# Patient Record
Sex: Female | Born: 1995 | Race: White | Hispanic: No | Marital: Married | Smoking: Never smoker
Health system: Southern US, Community
[De-identification: ages and names within clinical notes are randomized; demographics above are authoritative.]

## PROBLEM LIST (undated history)

## (undated) HISTORY — PX: WISDOM TOOTH EXTRACTION: SHX21

---
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
## 2017-07-17 ENCOUNTER — Ambulatory Visit (INDEPENDENT_AMBULATORY_CARE_PROVIDER_SITE_OTHER): Payer: Managed Care, Other (non HMO)

## 2017-07-17 ENCOUNTER — Encounter (HOSPITAL_COMMUNITY): Payer: Self-pay | Admitting: Emergency Medicine

## 2017-07-17 ENCOUNTER — Ambulatory Visit (HOSPITAL_COMMUNITY)
Admission: EM | Admit: 2017-07-17 | Discharge: 2017-07-17 | Disposition: A | Discharge status: Home / Self Care | Payer: Managed Care, Other (non HMO) | Attending: Internal Medicine | Admitting: Internal Medicine

## 2017-07-17 DIAGNOSIS — S99922A Unspecified injury of left foot, initial encounter: Secondary | ICD-10-CM

## 2017-07-17 NOTE — ED Provider Notes (Signed)
[facility]    CSN: [id number] Arrival date & time: 07/17/17  1100     History   Chief Complaint Chief Complaint  Patient presents with  . Foot Injury    HPI Judith GriffinsLydia A Nguyen is a 21 y.o. female.   Presenting today for left foot and ankle pain onset 5 days ago after a man landed on her playing volleyball. She reports pain to be 4/10 currently. Pain is most prominent at the 4 and 5th metatarsal area and over the lateral ankle area. Patient have tried RICE therapy at home with no relief. She is able to ambulate. She has no numbness or tingling or weakness.   HPI  History reviewed. No pertinent past medical history.  There are no active problems to display for this patient.   History reviewed. No pertinent surgical history.  OB History    No data available       Home Medications    Prior to Admission medications   Not on File    Family History History reviewed. No pertinent family history.  Social History Social History   Tobacco Use  . Smoking status: Never Smoker  . Smokeless tobacco: Never Used  Substance Use Topics  . Alcohol use: No    Frequency: Never  . Drug use: No     Allergies   Patient has no known allergies.   Review of Systems Review of Systems  Constitutional:       See HPI     Physical Exam Triage Vital Signs ED Triage Vitals  Enc Vitals Group     BP 07/17/17 1123 (!) 148/89     Pulse Rate 07/17/17 1123 89     Resp 07/17/17 1123 16     Temp 07/17/17 1123 98.5 F (36.9 C)     Temp Source 07/17/17 1123 Oral     SpO2 07/17/17 1123 99 %     Weight --      Height --      Head Circumference --      Peak Flow --      Pain Score 07/17/17 1124 6     Pain Loc --      Pain Edu? --      Excl. in GC? --    No data found.  Updated Vital Signs BP (!) 148/89 (BP Location: Left Arm)   Pulse 89   Temp 98.5 F (36.9 C) (Oral)   Resp 16   LMP 07/10/2017 (Exact Date)   SpO2 99%   Physical Exam  Constitutional: She is  oriented to person, place, and time. She appears well-developed and well-nourished.  Cardiovascular: Normal rate.  Pulmonary/Chest: Effort normal.  Musculoskeletal:  Left foot and ankle has full ROM. No swelling. No bruising. Pain on palpation noted over 4th and 5th metatarsal and over the lateral malleolus area. She is able to ambulate and bear weight.   Neurological: She is alert and oriented to person, place, and time.  Nursing note and vitals reviewed.    UC Treatments / Results  Labs (all labs ordered are listed, but only abnormal results are displayed) Labs Reviewed - No data to display  EKG  EKG Interpretation None       Radiology Dg Ankle Complete Left  Result Date: 07/17/2017 CLINICAL DATA:  Ankle pain post sports trauma. EXAM: LEFT ANKLE COMPLETE - 3+ VIEW COMPARISON:  None. FINDINGS: There is no evidence of fracture, dislocation, or joint effusion. There is no evidence of arthropathy or  other focal bone abnormality. Soft tissues are unremarkable. IMPRESSION: Negative. Electronically Signed   By: Ted Mcalpineobrinka  Dimitrova M.D.   On: 07/17/2017 12:02   Dg Foot Complete Left  Result Date: 07/17/2017 CLINICAL DATA:  Per pt: Monday night during a volleyball game, blocking a spike and the opponent came down on the left foot and ankle. Pain is the left foot, anterior left ankle, superior metatarsal and lateral left foot with pain extending to the lateral os calcis. EXAM: LEFT FOOT - COMPLETE 3+ VIEW COMPARISON:  07/17/2017 FINDINGS: There is no evidence of fracture or dislocation. There is no evidence of arthropathy or other focal bone abnormality. Soft tissues are unremarkable. IMPRESSION: Negative. Electronically Signed   By: Norva PavlovElizabeth  Brown M.D.   On: 07/17/2017 12:08    Procedures Procedures (including critical care time)  Medications Ordered in UC Medications - No data to display   Initial Impression / Assessment and Plan / UC Course  I have reviewed the triage vital signs  and the nursing notes.  Pertinent labs & imaging results that were available during my care of the patient were reviewed by me and considered in my medical decision making (see chart for details).  Final Clinical Impressions(s) / UC Diagnoses   Final diagnoses:  Injury of left foot, initial encounter   Xrays negative for fracture. Continue the RICE therapy. Avoid sport until pain resolved. F/u with PCP or Orthopedic for no improvement.    ED Discharge Orders    None     Controlled Substance Prescriptions [place] Controlled Substance Registry consulted? Not Applicable   Lucia EstelleZheng, Nygel Prokop, NP 07/17/17 1324

## 2017-07-17 NOTE — ED Triage Notes (Signed)
Pt reports left foot inj/pain onset 5 days  Reports she was playing volleyball and somebody landed on her foot  C/o pain on top of foot and lateral malleolus   camwalker present upon arrival.  A&o x4... NAD... Ambulatory

## 2017-10-13 ENCOUNTER — Other Ambulatory Visit: Payer: Self-pay

## 2017-10-13 ENCOUNTER — Emergency Department (HOSPITAL_COMMUNITY)
Admission: EM | Admit: 2017-10-13 | Discharge: 2017-10-13 | Disposition: A | Discharge status: Home / Self Care | Payer: Managed Care, Other (non HMO) | Attending: Emergency Medicine | Admitting: Emergency Medicine

## 2017-10-13 ENCOUNTER — Emergency Department (HOSPITAL_COMMUNITY): Payer: Managed Care, Other (non HMO)

## 2017-10-13 DIAGNOSIS — R1013 Epigastric pain: Secondary | ICD-10-CM | POA: Diagnosis present

## 2017-10-13 DIAGNOSIS — R1011 Right upper quadrant pain: Secondary | ICD-10-CM

## 2017-10-13 DIAGNOSIS — K2900 Acute gastritis without bleeding: Secondary | ICD-10-CM | POA: Diagnosis not present

## 2017-10-13 LAB — COMPREHENSIVE METABOLIC PANEL
ALT: 11 U/L — AB (ref 14–54)
AST: 17 U/L (ref 15–41)
Albumin: 4.2 g/dL (ref 3.5–5.0)
Alkaline Phosphatase: 43 U/L (ref 38–126)
Anion gap: 12 (ref 5–15)
BUN: 10 mg/dL (ref 6–20)
CO2: 20 mmol/L — AB (ref 22–32)
CREATININE: 0.84 mg/dL (ref 0.44–1.00)
Calcium: 9.4 mg/dL (ref 8.9–10.3)
Chloride: 105 mmol/L (ref 101–111)
GFR calc Af Amer: >60 mL/min (ref >60)
GLUCOSE: 100 mg/dL — AB (ref 65–99)
Potassium: 4 mmol/L (ref 3.5–5.1)
SODIUM: 137 mmol/L (ref 135–145)
Total Bilirubin: 0.8 mg/dL (ref 0.3–1.2)
Total Protein: 7 g/dL (ref 6.5–8.1)

## 2017-10-13 LAB — I-STAT BETA HCG BLOOD, ED (MC, WL, AP ONLY): I-stat hCG, quantitative: <5 m[IU]/mL (ref <5)

## 2017-10-13 LAB — CBC
HCT: 40.6 % (ref 36.0–46.0)
Hemoglobin: 13.7 g/dL (ref 12.0–15.0)
MCH: 29.4 pg (ref 26.0–34.0)
MCHC: 33.7 g/dL (ref 30.0–36.0)
MCV: 87.1 fL (ref 78.0–100.0)
PLATELETS: 211 10*3/uL (ref 150–400)
RBC: 4.66 MIL/uL (ref 3.87–5.11)
RDW: 12.2 % (ref 11.5–15.5)
WBC: 6.1 10*3/uL (ref 4.0–10.5)

## 2017-10-13 LAB — LIPASE, BLOOD: Lipase: 42 U/L (ref 11–51)

## 2017-10-13 MED ORDER — SUCRALFATE 1 GM/10ML PO SUSP: 1.0000 g | Freq: Three times a day (TID) | ORAL | 0 refills | Status: DC

## 2017-10-13 MED ORDER — PANTOPRAZOLE SODIUM 40 MG IV SOLR
40.0000 mg | Freq: Once | INTRAVENOUS | Status: AC
  Administered 2017-10-13: 40 mg via INTRAVENOUS
  Filled 2017-10-13: qty 40

## 2017-10-13 MED ORDER — SUCRALFATE 1 GM/10ML PO SUSP
1.0000 g | Freq: Once | ORAL | Status: AC
  Administered 2017-10-13: 1 g via ORAL
  Filled 2017-10-13: qty 10

## 2017-10-13 MED ORDER — ONDANSETRON HCL 4 MG/2ML IJ SOLN
4.0000 mg | Freq: Once | INTRAMUSCULAR | Status: AC
  Administered 2017-10-13: 4 mg via INTRAVENOUS
  Filled 2017-10-13: qty 2

## 2017-10-13 MED ORDER — PANTOPRAZOLE SODIUM 20 MG PO TBEC: 20.0000 mg | DELAYED_RELEASE_TABLET | Freq: Every day | ORAL | 0 refills | Status: DC

## 2017-10-13 MED ORDER — ONDANSETRON HCL 4 MG PO TABS: 4.0000 mg | ORAL_TABLET | Freq: Four times a day (QID) | ORAL | 0 refills | Status: DC

## 2017-10-13 MED ORDER — GI COCKTAIL ~~LOC~~: 30.0000 mL | Freq: Once | ORAL | Status: DC

## 2017-10-13 NOTE — ED Triage Notes (Signed)
Pt states yesterday she started having epigastric pain with nausea. No vomiting  or diarrhea.

## 2017-10-13 NOTE — Discharge Instructions (Signed)
Medications: Protonix, Carafate, Zofran  Treatment: Take Protonix once daily. Take Carafate 4 times daily, once before each meal and at bedtime. Take Zofran every 6 hours as needed for nausea or vomiting.  Follow-up: Please follow up with gastroenterology for further evaluation and treatment of your symptoms. Please return to the emergency department if you develop any new or worsening symptoms including severe pain lasting more the 6-8 hours, intractable vomiting, dark or bloody stools, fever over 100.4, or any other concerning symptoms.

## 2017-10-13 NOTE — ED Provider Notes (Signed)
[facility]ENC[facility] Provider Note   CSN: [id number] Arrival date & time: 10/13/17  0703     History   Chief Complaint Chief Complaint  Patient presents with  . Abdominal Pain    HPI Judith GriffinsLydia A Nguyen is a 22 y.o. female is previously healthy who presents with pain around 1 AM this evening.  Patient reports she has had associated nausea related to severe pain, but no vomiting.  She reports intermittent, waxing and waning sharp pain in her epigastric area.  Her pain does not radiate.  She reports having one episode of diarrhea last night, however none today.  No bloody stools.  She reports she has had some mild nasal congestion, however no cough, fever, or body aches.  She reports having soup and toast last night around 7:30 PM.  She did not have anything that time and 1 AM when her pain started.  She has never experienced anything like this before.  She took ibuprofen at home without relief.  States she has been taking ibuprofen over the past 3 months and she sprained her ankle.  She reports she has not been taking as much lately, but was taking 3 or 4 200 mg tablets at a time, sometimes more than once a day.  She denies any chest pain, shortness of breath, urinary symptoms, abnormal vaginal bleeding or discharge.  LMP 09/15/2017.  Patient reports she drinks alcohol, but usually only 1 glass of wine per night.  HPI  No past medical history on file.  There are no active problems to display for this patient.   No past surgical history on file.  OB History    No data available       Home Medications    Prior to Admission medications   Medication Sig Start Date End Date Taking? Authorizing Provider  ondansetron (ZOFRAN) 4 MG tablet Take 1 tablet (4 mg total) by mouth every 6 (six) hours. 10/13/17   Nazario Russom, Waylan BogaAlexandra M, PA-C  pantoprazole (PROTONIX) 20 MG tablet Take 1 tablet (20 mg total) by mouth daily. 10/13/17   Reeve Turnley, Waylan BogaAlexandra M, PA-C  sucralfate (CARAFATE) 1  GM/10ML suspension Take 10 mLs (1 g total) by mouth 4 (four) times daily -  with meals and at bedtime. 10/13/17   Emi HolesLaw, Markeith Jue M, PA-C    Family History No family history on file.  Social History Social History   Tobacco Use  . Smoking status: Never Smoker  . Smokeless tobacco: Never Used  Substance Use Topics  . Alcohol use: No    Frequency: Never  . Drug use: No     Allergies   Patient has no known allergies.   Review of Systems Review of Systems  Constitutional: Negative for chills and fever.  HENT: Positive for congestion. Negative for facial swelling and sore throat.   Respiratory: Negative for shortness of breath.   Cardiovascular: Negative for chest pain.  Gastrointestinal: Positive for abdominal pain and nausea. Negative for vomiting.  Genitourinary: Negative for dysuria.  Musculoskeletal: Negative for back pain.  Skin: Negative for rash and wound.  Neurological: Negative for headaches.  Psychiatric/Behavioral: The patient is not nervous/anxious.      Physical Exam Updated Vital Signs BP 109/69 (BP Location: Right Arm)   Pulse (!) 57   Temp 98.6 F (37 C) (Oral)   Resp 16   LMP 09/15/2017   SpO2 100%   Physical Exam  Constitutional: She appears well-developed and well-nourished. No distress.  HENT:  Head:  Normocephalic and atraumatic.  Mouth/Throat: Oropharynx is clear and moist. No oropharyngeal exudate.  Eyes: Conjunctivae are normal. Pupils are equal, round, and reactive to light. Right eye exhibits no discharge. Left eye exhibits no discharge. No scleral icterus.  Neck: Normal range of motion. Neck supple. No thyromegaly present.  Cardiovascular: Regular rhythm, normal heart sounds and intact distal pulses. Exam reveals no gallop and no friction rub.  No murmur heard. Pulmonary/Chest: Effort normal and breath sounds normal. No stridor. No respiratory distress. She has no wheezes. She has no rales.  Abdominal: Soft. Bowel sounds are normal. She  exhibits no distension. There is tenderness in the right upper quadrant and epigastric area. There is positive Murphy's sign. There is no rebound, no guarding and no tenderness at McBurney's point.  Musculoskeletal: She exhibits no edema.  Lymphadenopathy:    She has no cervical adenopathy.  Neurological: She is alert. Coordination normal.  Skin: Skin is warm and dry. No rash noted. She is not diaphoretic. No pallor.  Psychiatric: She has a normal mood and affect.  Nursing note and vitals reviewed.    ED Treatments / Results  Labs (all labs ordered are listed, but only abnormal results are displayed) Labs Reviewed  COMPREHENSIVE METABOLIC PANEL - Abnormal; Notable for the following components:      Result Value   CO2 20 (*)    Glucose, Bld 100 (*)    ALT 11 (*)    All other components within normal limits  LIPASE, BLOOD  CBC  URINALYSIS, ROUTINE W REFLEX MICROSCOPIC  I-STAT BETA HCG BLOOD, ED ([facility], [facility], [facility] ONLY)    EKG  EKG Interpretation None       Radiology US Abdomen Limited Ruq  Result Date: 10/13/2017 CLINICAL DATA:  22 year old female with right upper quadrant pain for 2 days. Initial encounter. EXAM: ULTRASOUND ABDOMEN LIMITED RIGHT UPPER QUADRANT COMPARISON:  None. FINDINGS: Gallbladder: No gallstones or wall thickening visualized. No sonographic Murphy sign noted by sonographer. Common bile duct: Diameter: 1.6 mm Liver: No focal lesion identified. Within normal limits in parenchymal echogenicity. Portal vein is patent on color Doppler imaging with normal direction of blood flow towards the liver. IMPRESSION: Negative right upper quadrant ultrasound. Electronically Signed   By: Lacy Duverney M.D.   On: 10/13/2017 12:07    Procedures Procedures (including critical care time)  Medications Ordered in ED Medications  ondansetron (ZOFRAN) injection 4 mg (4 mg Intravenous Given 10/13/17 1116)  pantoprazole (PROTONIX) injection 40 mg (40 mg Intravenous Given 10/13/17 1116)    sucralfate (CARAFATE) 1 GM/10ML suspension 1 g (1 g Oral Given 10/13/17 1314)     Initial Impression / Assessment and Plan / ED Course  I have reviewed the triage vital signs and the nursing notes.  Pertinent labs & imaging results that were available during my care of the patient were reviewed by me and considered in my medical decision making (see chart for details).     Patient with suspected gastritis versus ulcer, possibly from patient's increased NSAID use from her sprained ankle.  Labs are unremarkable.  Right upper quadrant ultrasound is negative.  Patient is feeling better after IV Zofran and Protonix.  Will initiate Protonix, Zofran, Carafate with follow-up to GI for further evaluation.  Patient advised to avoid NSAIDs for right now.  Return precautions discussed.  Patient understands and agrees with plan.  Patient vitals stable throughout ED course and discharged in satisfactory condition.  Final Clinical Impressions(s) / ED Diagnoses   Final diagnoses:  Epigastric pain  RUQ pain  Other acute gastritis without hemorrhage    ED Discharge Orders        Ordered    pantoprazole (PROTONIX) 20 MG tablet  Daily     10/13/17 1238    sucralfate (CARAFATE) 1 GM/10ML suspension  3 times daily with meals & bedtime     10/13/17 1238    ondansetron (ZOFRAN) 4 MG tablet  Every 6 hours     10/13/17 1238       Emi Holes, PA-C 10/13/17 1419    Cathren Laine, MD 10/13/17 [phone]

## 2017-10-13 NOTE — ED Notes (Signed)
Patient in ultrasound.

## 2017-10-13 NOTE — ED Notes (Signed)
Called for vitals, no answer 

## 2018-05-16 ENCOUNTER — Ambulatory Visit (HOSPITAL_COMMUNITY)
Admission: EM | Admit: 2018-05-16 | Discharge: 2018-05-16 | Disposition: A | Discharge status: Home / Self Care | Payer: Managed Care, Other (non HMO) | Attending: Internal Medicine | Admitting: Internal Medicine

## 2018-05-16 ENCOUNTER — Encounter (HOSPITAL_COMMUNITY): Payer: Self-pay | Admitting: Emergency Medicine

## 2018-05-16 ENCOUNTER — Other Ambulatory Visit: Payer: Self-pay

## 2018-05-16 DIAGNOSIS — R52 Pain, unspecified: Secondary | ICD-10-CM

## 2018-05-16 DIAGNOSIS — R0789 Other chest pain: Secondary | ICD-10-CM

## 2018-05-16 DIAGNOSIS — R1010 Upper abdominal pain, unspecified: Secondary | ICD-10-CM

## 2018-05-16 DIAGNOSIS — J029 Acute pharyngitis, unspecified: Secondary | ICD-10-CM | POA: Diagnosis present

## 2018-05-16 LAB — POCT RAPID STREP A: STREPTOCOCCUS, GROUP A SCREEN (DIRECT): NEGATIVE

## 2018-05-16 MED ORDER — ACETAMINOPHEN 160 MG/5ML PO SOLN
ORAL | Status: AC
  Filled 2018-05-16: qty 20.3

## 2018-05-16 MED ORDER — ACETAMINOPHEN 160 MG/5ML PO SOLN
650.0000 mg | Freq: Once | ORAL | Status: AC
  Administered 2018-05-16: 650 mg via ORAL

## 2018-05-16 MED ORDER — PREDNISONE 20 MG PO TABS: ORAL_TABLET | ORAL | 0 refills | Status: DC

## 2018-05-16 NOTE — Discharge Instructions (Signed)
You may take 500mg  Tylenol every 6 hours for pain and inflammation. Benadryl at bedtime 50mg  can help you sleep tonight.

## 2018-05-16 NOTE — ED Triage Notes (Signed)
Sore throat, fever, and body aches started yesterday.  Patient looks like she does not feel well.

## 2018-05-16 NOTE — ED Provider Notes (Signed)
  MRN: [id number] DOB: 02-25-1996  Subjective:   Judith Nguyen is a 22 y.o. female presenting for 1 day history of throat pain, fever, body aches, malaise, belly pain, chest pain. Chest pain is over mid-sternum, constant, has difficulty breathing, rated 7/10. Belly pain is epigastric, constant, 8/10, non-radiating. Has a history of gastritis, has had to take Protonix, Carafate.  She is not currently taking these medications. Does not have a GI doctor or a PCP. She was taking ibuprofen, Advil, was advised that she may have had gastritis from this so she has stopped using it. Denies dysuria, hematuria, urinary frequency.   No Known Allergies  Past medical history of gastritis. Denies past surgical history. Her family history includes Healthy in her mother.   Objective:   Vitals: BP 111/67 (BP Location: Left Arm)   Pulse (!) 120   Temp (!) 100.9 F (38.3 C) (Oral)   Resp (!) 22   SpO2 100%   Physical Exam  Constitutional: She is oriented to person, place, and time. She appears well-developed and well-nourished.  Eyes: Right eye exhibits no discharge. Left eye exhibits no discharge. No scleral icterus.  Neck: Normal range of motion. Neck supple.  Cardiovascular: Normal rate, regular rhythm and intact distal pulses. Exam reveals no gallop and no friction rub.  No murmur heard. Pulmonary/Chest: Effort normal and breath sounds normal. No stridor. No respiratory distress. She has no wheezes. She has no rales. She exhibits tenderness.  Abdominal: Soft. Bowel sounds are normal. She exhibits no distension and no mass. There is tenderness. There is no rebound and no guarding.  Lymphadenopathy:    She has no cervical adenopathy.  Neurological: She is alert and oriented to person, place, and time.  Skin: Skin is warm and dry.   Results for orders placed or performed during the hospital encounter of 05/16/18 (from the past 24 hour(s))  POCT rapid strep A [facility])     Status: None   Collection Time: 05/16/18  3:28 PM  Result Value Ref Range   Streptococcus, Group A Screen (Direct) NEGATIVE NEGATIVE   Assessment and Plan :   Acute pharyngitis, unspecified etiology  Sore throat  Atypical chest pain  Upper abdominal pain  Body aches  Patient has severe chest and abdominal pain, I counseled patient and her mother that it would be important to obtain a CT of the abdomen to assure that she is not undergoing a medical emergency.  Patient and her mother were not agreeable to going to the ER at this moment.  We therefore decided to address her pharyngitis and severe throat pain with a steroid course given inflammation of her tonsils without exudates.  Strep culture is pending.  Recommend supportive care outside of that.  Counseled patient on potential for adverse effects with medications prescribed today, patient verbalized understanding.  ER precautions reviewed.   Wallis Bamberg, PA-C 05/16/18 1620

## 2018-05-19 LAB — CULTURE, GROUP A STREP (THRC)

## 2018-05-20 ENCOUNTER — Telehealth (HOSPITAL_COMMUNITY): Payer: Self-pay

## 2018-05-20 NOTE — Telephone Encounter (Signed)
Culture is positive for non group A Strep germ.  This is a finding of uncertain significance; not the typical 'strep throat' germ.  Attempted to reach patient. No answer at this time.  

## 2018-09-21 ENCOUNTER — Ambulatory Visit (INDEPENDENT_AMBULATORY_CARE_PROVIDER_SITE_OTHER): Payer: Managed Care, Other (non HMO)

## 2018-09-21 ENCOUNTER — Encounter: Payer: Self-pay | Admitting: Sports Medicine

## 2018-09-21 ENCOUNTER — Ambulatory Visit: Payer: Self-pay

## 2018-09-21 ENCOUNTER — Ambulatory Visit (INDEPENDENT_AMBULATORY_CARE_PROVIDER_SITE_OTHER): Payer: Managed Care, Other (non HMO) | Admitting: Sports Medicine

## 2018-09-21 VITALS — BP 110/70 | HR 74 | Ht 68.5 in | Wt 132.4 lb

## 2018-09-21 DIAGNOSIS — M25572 Pain in left ankle and joints of left foot: Secondary | ICD-10-CM

## 2018-09-21 DIAGNOSIS — M25872 Other specified joint disorders, left ankle and foot: Secondary | ICD-10-CM | POA: Diagnosis not present

## 2018-09-21 NOTE — Progress Notes (Signed)
Judith Nguyen. [facility] [phone]  Judith Nguyen - 23 y.o. female MRN [id number]  Date of birth: [birth year]  Visit Date: September 21, 2018  PCP: Patient, No Pcp Per   Referred by: No ref. provider found  SUBJECTIVE:  Chief Complaint  Patient presents with  . Initial Assessment  . L ankle pain    Sports injury 07/2017, XRs done. She has been wearing boot. Has been seen at [facility] more recently, has XR and MRI. She has dones PT at [facility] in [place], [place] Improved. Sx started to flare up over the past couple of weeks.     HPI: Patient presents for recurrent left ankle pain.  She has been having issues with her ankles for several years and last year had a injury while playing pickup volleyball.  She is an avid Designer, jewellery.  This had an extensive work-up previously that per her report was nonrevealing.  she underwent physical therapy with good resolution in her symptoms although she has had progressive worsening of the pain over the past several months.  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  The pain is localized over the anterior lateral ankle.  She denies any recurrent injury.  She has not tried any specific medications at this time due to a prior episode of gastritis with anti-inflammatories.  REVIEW OF SYSTEMS: Otherwise 12 point review of systems performed and is negative  HISTORY:  Prior history reviewed and updated per electronic medical record.  Social History   Occupational History  . Not on file  Tobacco Use  . Smoking status: Never Smoker  . Smokeless tobacco: Never Used  Substance and Sexual Activity  . Alcohol use: No    Frequency: Never  . Drug use: No  . Sexual activity: Never   Social History   Social History Narrative  . Not on file   No past medical history on file.  Past Surgical  History:  Procedure Laterality Date  . WISDOM TOOTH EXTRACTION      family history includes Healthy in her mother.  DATA OBTAINED & REVIEWED:  Recent Labs    10/13/17 0710  CALCIUM 9.4  AST 17  ALT 11*   No problems updated. No specialty comments available. X-rays of the left ankle obtained today that are negative.  No evidence of significant degenerative changes.  OBJECTIVE:  VS:  HT:5' 8.5" (174 cm)   WT:132 lb 6.4 oz (60.1 kg)  BMI:19.84    BP:   HR: bpm  TEMP: ( )  RESP:    PHYSICAL EXAM: Well-developed, Well-nourished and In no acute distress Pupils are equal., EOM intact without nystagmus. and No scleral icterus. Alert & appropriately interactive. and Not depressed or anxious appearing. Warm and well perfused Calf supple with no pain with squeeze (Negative Homans) Left ankle: Well aligned.  No significant deformity.  She does have a small amount of prominence over the anterior lateral ankle that is soft tissue swelling in nature.  She has pain directly over this region with palpation.  Minimal pain over the peroneal tendons.  No significant pain over the posterior tibialis tendon.  Anterior syndesmosis is a small amount of pain as well.  Her ankle dorsiflexion, plantarflexion, inversion and eversion strength is 5+/5 with no significant reproduction of her symptoms.  She has stable ankle drawer testing as  well as minimal pain with talar tilting.  No significant pain with kleiger testing or with cotton testing.   ASSESSMENT  No diagnosis found.   PROCEDURES:  US Guided Injection per procedure note  PLAN:  Pertinent additional documentation may be included in corresponding procedure notes, imaging studies, problem based documentation and patient instructions.  No problem-specific Assessment & Plan notes found for this encounter. 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  She may have had a  prior anterior syndesmosis ligament injury as well as there is some thickening in this area on ultrasound but she does not seem to have any exacerbation of her symptoms with stressing of this ligament today.  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  If she has any worsening symptoms would consider repeating MRI with consideration of additional intra-articular contrast.  Plan to follow-up in 4 weeks and can advance her home therapeutic exercises that she is already been doing previously from physical therapy. RICE (Rest, ICE, Compression, Elevation) principles reviewed with the patient. Discussed using a compression sleeve/sock if she is on her feet to help with some of the swelling discomfort. Activity modifications and the importance of avoiding exacerbating activities (limiting pain to no more than a 4 / 10 during or following activity) recommended and discussed. Discussed red flag symptoms that warrant earlier emergent evaluation and patient voices understanding. No orders of the defined types were placed in this encounter. Lab Orders  No laboratory test(s) ordered today   Imaging Orders  No imaging studies ordered today   Referral Orders  No referral(s) requested today     No follow-ups on file.          Andrena Mews, DO    [facility]

## 2018-09-21 NOTE — Patient Instructions (Signed)

## 2018-09-21 NOTE — Procedures (Signed)
PROCEDURE NOTE:  Ultrasound Guided: Injection: Left ankle Images were obtained and interpreted by myself, Gaspar Bidding, DO  Images have been saved and stored to PACS system. Images obtained on: GE S7 Ultrasound machine    ULTRASOUND FINDINGS:  Ankle is a very small supraphysiologic effusion.  She does have thickening of the anterior talofibular ligament as well as the tibiofibular ligament/anterior syndesmosis.  There is a small amount of increased neovascularity.  DESCRIPTION OF PROCEDURE:  The patient's clinical condition is marked by substantial pain and/or significant functional disability. Other conservative therapy has not provided relief, is contraindicated, or not appropriate. There is a reasonable likelihood that injection will significantly improve the patient's pain and/or functional impairment.   After discussing the risks, benefits and expected outcomes of the injection and all questions were reviewed and answered, the patient wished to undergo the above named procedure.  Verbal consent was obtained.  The ultrasound was used to identify the target structure and adjacent neurovascular structures. The skin was then prepped in sterile fashion and the target structure was injected under direct visualization using sterile technique as below:  Single injection performed as below: PREP: Alcohol and Ethel Chloride APPROACH:direct, single injection, 25g 1.5 in. INJECTATE: 1 cc 0.5% Marcaine and 1 cc 40mg /mL DepoMedrol ASPIRATE: None DRESSING: Band-Aid  Post procedural instructions including recommending icing and warning signs for infection were reviewed.    This procedure was well tolerated and there were no complications.   IMPRESSION: Succesful Ultrasound Guided: Injection

## 2018-10-21 ENCOUNTER — Ambulatory Visit (INDEPENDENT_AMBULATORY_CARE_PROVIDER_SITE_OTHER): Payer: Managed Care, Other (non HMO) | Admitting: Sports Medicine

## 2018-10-21 ENCOUNTER — Encounter: Payer: Self-pay | Admitting: Sports Medicine

## 2018-10-21 VITALS — BP 118/70 | HR 86 | Ht 68.5 in | Wt 127.4 lb

## 2018-10-21 DIAGNOSIS — M25572 Pain in left ankle and joints of left foot: Secondary | ICD-10-CM | POA: Diagnosis not present

## 2018-10-21 DIAGNOSIS — M25372 Other instability, left ankle: Secondary | ICD-10-CM

## 2018-10-21 DIAGNOSIS — S93492S Sprain of other ligament of left ankle, sequela: Secondary | ICD-10-CM

## 2018-10-21 DIAGNOSIS — S93432S Sprain of tibiofibular ligament of left ankle, sequela: Secondary | ICD-10-CM | POA: Diagnosis not present

## 2018-10-21 NOTE — Progress Notes (Signed)
Judith Nguyen. [facility] [phone]  Judith Nguyen - 23 y.o. female MRN [id number]  Date of birth: [birth year]  Visit Date: October 21, 2018  PCP: Patient, No Pcp Per   Referred by: No ref. provider found  SUBJECTIVE:  Chief Complaint  Patient presents with  . Follow-up    L ankle pain.  L ankle injection - 09/21/18.    HPI: Patient is here for follow-up with a left ankle injection.  She has had only minimal improvement but continues to have mild pain especially of the anterior aspect of her ankle.  She is status post injection on 09/21/2018.  She continues to ice at the end of the day.  She is on her feet for school and from work fairly extensively.  REVIEW OF SYSTEMS: No significant nighttime awakenings due to this issue. Denies fevers, chills, recent weight gain or weight loss.  No night sweats.  She does have some numbness and tingling in the left lateral foot.  This is less swollen but still painful.  She is been able to wean out of the boot over the past 1-1/2 weeks  HISTORY:  Prior history reviewed and updated per electronic medical record.  Patient Active Problem List   Diagnosis Date Noted  . Left ankle pain 10/21/2018    L ankle XR - 09/21/18    Social History   Occupational History  . Not on file  Tobacco Use  . Smoking status: Never Smoker  . Smokeless tobacco: Never Used  Substance and Sexual Activity  . Alcohol use: No    Frequency: Never  . Drug use: No  . Sexual activity: Not on file   Social History   Social History Narrative  . Not on file    OBJECTIVE:  VS:  HT:5' 8.5" (174 cm)   WT:127 lb 6.4 oz (57.8 kg)  BMI:19.09    BP:118/70  HR:86bpm  TEMP: ( )  RESP:98 %   PHYSICAL EXAM: Adult female. No acute distress.  Alert and appropriate. Left ankle is well aligned.  She has pain with palpation directly over the anterior syndesmosis of the ankle.  Minimal pain over the base of  the fifth metatarsal.  No significant pain over the peroneal tendons today.  Ankle drawer testing is stable however she does have pain with terminal dorsiflexion and terminal plantarflexion.  Pain is worse with cotton testing.  Patient has previously undergone MRI of her ankle without significant evidence of abnormality    ASSESSMENT:  1. High ankle sprain of left lower extremity, sequela   2. Left ankle pain, unspecified chronicity   3. Ankle instability, left     PROCEDURES:  None  PLAN:  Pertinent additional documentation may be included in corresponding procedure notes, imaging studies, problem based documentation and patient instructions.  No problem-specific Assessment & Plan notes found for this encounter.   Unfortunately she is actually having fairly significant symptoms with syndesmosis testing and I am concerned that she likely has a syndesmotic injury that will likely require internal fixation.  I would like to send her to Dr. Aldean Baker at [facility] orthopedics for evaluation and likely stress film x-rays.  I will defer to his expertise for this.  Her prior MRI was nonspecific but unfortunately these can be falsely negative but still have significant dynamic instability   Activity modifications and the importance of avoiding exacerbating activities (limiting pain to no more than a 4 /  10 during or following activity) recommended and discussed.  Discussed red flag symptoms that warrant earlier emergent evaluation and patient voices understanding.   No orders of the defined types were placed in this encounter.  Lab Orders  No laboratory test(s) ordered today   Imaging Orders  No imaging studies ordered today    Referral Orders     AMB referral to orthopedics   No follow-ups on file.          Judith Mews, DO    [facility]

## 2018-10-24 ENCOUNTER — Ambulatory Visit (HOSPITAL_COMMUNITY)
Admission: EM | Admit: 2018-10-24 | Discharge: 2018-10-24 | Disposition: A | Discharge status: Home / Self Care | Payer: Managed Care, Other (non HMO) | Attending: Internal Medicine | Admitting: Internal Medicine

## 2018-10-24 ENCOUNTER — Encounter (HOSPITAL_COMMUNITY): Payer: Self-pay | Admitting: *Deleted

## 2018-10-24 ENCOUNTER — Other Ambulatory Visit: Payer: Self-pay

## 2018-10-24 ENCOUNTER — Encounter: Payer: Self-pay | Admitting: Sports Medicine

## 2018-10-24 DIAGNOSIS — J020 Streptococcal pharyngitis: Secondary | ICD-10-CM | POA: Diagnosis not present

## 2018-10-24 LAB — POCT RAPID STREP A: Streptococcus, Group A Screen (Direct): POSITIVE — AB

## 2018-10-24 MED ORDER — PENICILLIN V POTASSIUM 500 MG PO TABS: 500.0000 mg | ORAL_TABLET | Freq: Two times a day (BID) | ORAL | 0 refills | Status: AC

## 2018-10-24 NOTE — ED Triage Notes (Signed)
C/O sore throat x 1 wk.  Initially had fever, but fever has since resolved.  C/o swelling in throat.

## 2018-11-01 ENCOUNTER — Encounter (INDEPENDENT_AMBULATORY_CARE_PROVIDER_SITE_OTHER): Payer: Self-pay | Admitting: Orthopedic Surgery

## 2018-11-01 ENCOUNTER — Ambulatory Visit (INDEPENDENT_AMBULATORY_CARE_PROVIDER_SITE_OTHER): Payer: Managed Care, Other (non HMO) | Admitting: Orthopedic Surgery

## 2018-11-01 VITALS — Ht 68.5 in | Wt 127.4 lb

## 2018-11-01 DIAGNOSIS — M25872 Other specified joint disorders, left ankle and foot: Secondary | ICD-10-CM | POA: Diagnosis not present

## 2018-11-01 NOTE — Progress Notes (Signed)
Office Visit Note   Patient: Judith Nguyen           Date of Birth: 1995-09-18           MRN: [id number] Visit Date: 11/01/2018              Requested by: Andrena Mews, DO [address] [place], [place] [address] PCP: Patient, No Pcp Per  Chief Complaint  Patient presents with  . Left Ankle - Pain      HPI: Patient is a 23 year old woman who states she has had a long history of a previous high ankle sprain.  She states she has been having about a year and a half history of pain anteriorly over the ankle.  She states that she has played volleyball and recently was wearing an ankle stabilizing orthosis.  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  Patient states that she did undergo ultrasound with Dr. Berline Chough as well as intra-articular ankle injection and she states that this did alleviate her pain.  Assessment & Plan: Visit Diagnoses:  1. Impingement syndrome of left ankle     Plan: Due to the improvement from intra-articular injection discussed that we could proceed with ankle arthroscopy for debridement of the impingement.  Feel that this should resolve approximately 75% of her symptoms.  If she has further symptoms from the syndesmosis internal fixation may be necessary.  Follow-Up Instructions: Return in about 2 weeks (around 11/15/2018).   Ortho Exam  Patient is alert, oriented, no adenopathy, well-dressed, normal affect, normal respiratory effort. Examination patient has a good pulse.  Proximally she has no tenderness to palpation across the syndesmosis distal palpation reproduces medial and lateral malleolar pain but does not reproduce syndesmosis pain.  The ankle joint anteriorly is maximally tender to palpation.  Anterior drawer is stable she has good ankle good subtalar range of motion.  Imaging: No results found. No images are attached to the encounter.  Labs: Lab Results    Component Value Date   REPTSTATUS 05/19/2018 FINAL 05/16/2018   CULT MODERATE STREPTOCOCCUS,BETA HEMOLYTIC NOT GROUP A 05/16/2018     Lab Results  Component Value Date   ALBUMIN 4.2 10/13/2017    Body mass index is 19.09 kg/m.  Orders:  No orders of the defined types were placed in this encounter.  No orders of the defined types were placed in this encounter.    Procedures: No procedures performed  Clinical Data: No additional findings.  ROS:  All other systems negative, except as noted in the HPI. Review of Systems  Objective: Vital Signs: Ht 5' 8.5" (1.74 m)   Wt 127 lb 6.4 oz (57.8 kg)   LMP 10/22/2018 (Exact Date)   BMI 19.09 kg/m   Specialty Comments:  No specialty comments available.  PMFS History: Patient Active Problem List   Diagnosis Date Noted  . Left ankle pain 10/21/2018   History reviewed. No pertinent past medical history.  Family History  Problem Relation Age of Onset  . Healthy Mother   . Healthy Father     Past Surgical History:  Procedure Laterality Date  . WISDOM TOOTH EXTRACTION     Social History   Occupational History  . Not on file  Tobacco Use  . Smoking status: Never Smoker  . Smokeless tobacco: Never Used  Substance and Sexual Activity  . Alcohol use: No    Frequency: Never  .  Drug use: No  . Sexual activity: Not on file       

## 2019-01-03 ENCOUNTER — Telehealth (INDEPENDENT_AMBULATORY_CARE_PROVIDER_SITE_OTHER): Payer: Self-pay

## 2019-01-03 NOTE — Telephone Encounter (Signed)
I tried to call patient and get her scheduled to come in at earlier appt time or to get rescheduled but pt did not answer. Left VM for pt to call back on both numbers.

## 2019-01-04 ENCOUNTER — Ambulatory Visit (INDEPENDENT_AMBULATORY_CARE_PROVIDER_SITE_OTHER): Payer: Managed Care, Other (non HMO) | Admitting: Physician Assistant

## 2019-01-04 ENCOUNTER — Ambulatory Visit (INDEPENDENT_AMBULATORY_CARE_PROVIDER_SITE_OTHER): Payer: Managed Care, Other (non HMO) | Admitting: Orthopedic Surgery

## 2019-03-08 ENCOUNTER — Encounter: Payer: Self-pay | Admitting: *Deleted

## 2019-03-21 ENCOUNTER — Encounter: Payer: Managed Care, Other (non HMO) | Admitting: Obstetrics & Gynecology

## 2019-06-16 NOTE — ED Provider Notes (Signed)
[facility]    CSN: [id number] Arrival date & time: 10/24/18  1610      History   Chief Complaint Chief Complaint  Patient presents with  . Sore Throat    HPI Judith Nguyen is a 23 y.o. female.   Pt w/o chronic med problems c/o sore throat x1 week.  She has had fever but is afebrile today. Reports that throat feels swollen.       History reviewed. No pertinent past medical history.  Patient Active Problem List   Diagnosis Date Noted  . Left ankle pain 10/21/2018    Past Surgical History:  Procedure Laterality Date  . WISDOM TOOTH EXTRACTION      OB History   No obstetric history on file.      Home Medications    Prior to Admission medications   Not on File    Family History Family History  Problem Relation Age of Onset  . Healthy Mother   . Healthy Father     Social History Social History   Tobacco Use  . Smoking status: Never Smoker  . Smokeless tobacco: Never Used  Substance Use Topics  . Alcohol use: No    Frequency: Never  . Drug use: No     Allergies   Patient has no known allergies.   Review of Systems Review of Systems  Constitutional: Negative for chills and fever.  HENT: Positive for sore throat. Negative for tinnitus.   Eyes: Negative for redness.  Respiratory: Negative for cough and shortness of breath.   Cardiovascular: Negative for chest pain and palpitations.  Gastrointestinal: Negative for abdominal pain, diarrhea, nausea and vomiting.  Genitourinary: Negative for dysuria, frequency and urgency.  Musculoskeletal: Negative for myalgias.  Skin: Negative for rash.       No lesions  Neurological: Negative for weakness.  Hematological: Does not bruise/bleed easily.  Psychiatric/Behavioral: Negative for suicidal ideas.     Physical Exam Triage Vital Signs ED Triage Vitals  Enc Vitals Group     BP 10/24/18 1723 129/80     Pulse Rate 10/24/18 1723 85     Resp 10/24/18 1723 16     Temp 10/24/18 1723  99.4 F (37.4 C)     Temp Source 10/24/18 1723 Oral     SpO2 10/24/18 1723 100 %     Weight --      Height --      Head Circumference --      Peak Flow --      Pain Score 10/24/18 1724 2     Pain Loc --      Pain Edu? --      Excl. in GC? --    No data found.  Updated Vital Signs BP 129/80   Pulse 85   Temp 99.4 F (37.4 C) (Oral)   Resp 16   LMP 10/22/2018 (Exact Date)   SpO2 100%   Visual Acuity Right Eye Distance:   Left Eye Distance:   Bilateral Distance:    Right Eye Near:   Left Eye Near:    Bilateral Near:     Physical Exam Vitals signs and nursing note reviewed.  Constitutional:      General: She is not in acute distress.    Appearance: She is well-developed.  HENT:     Head: Normocephalic and atraumatic.     Mouth/Throat:     Pharynx: Posterior oropharyngeal erythema present.     Tonsils: Tonsillar exudate present. No tonsillar abscesses.  Eyes:     General: No scleral icterus.    Conjunctiva/sclera: Conjunctivae normal.     Pupils: Pupils are equal, round, and reactive to light.  Neck:     Musculoskeletal: Normal range of motion and neck supple.     Thyroid: No thyromegaly.     Vascular: No JVD.     Trachea: No tracheal deviation.  Cardiovascular:     Rate and Rhythm: Normal rate and regular rhythm.     Heart sounds: Normal heart sounds. No murmur. No friction rub. No gallop.   Pulmonary:     Effort: Pulmonary effort is normal.     Breath sounds: Normal breath sounds.  Abdominal:     General: Bowel sounds are normal. There is no distension.     Palpations: Abdomen is soft.     Tenderness: There is no abdominal tenderness.  Musculoskeletal: Normal range of motion.  Lymphadenopathy:     Cervical: No cervical adenopathy.  Skin:    General: Skin is warm and dry.  Neurological:     Mental Status: She is alert and oriented to person, place, and time.     Cranial Nerves: No cranial nerve deficit.  Psychiatric:        Behavior: Behavior normal.         Thought Content: Thought content normal.        Judgment: Judgment normal.      UC Treatments / Results  Labs (all labs ordered are listed, but only abnormal results are displayed) Labs Reviewed  POCT RAPID STREP A - Abnormal; Notable for the following components:      Result Value   Streptococcus, Group A Screen (Direct) POSITIVE (*)    All other components within normal limits    EKG   Radiology No results found.  Procedures Procedures (including critical care time)  Medications Ordered in UC Medications - No data to display  Initial Impression / Assessment and Plan / UC Course  I have reviewed the triage vital signs and the nursing notes.  Pertinent labs & imaging results that were available during my care of the patient were reviewed by me and considered in my medical decision making (see chart for details).     Positive STrep A Final Clinical Impressions(s) / UC Diagnoses   Final diagnoses:  Strep throat   Discharge Instructions   None    ED Prescriptions    Medication Sig Dispense Auth. Provider   penicillin v potassium (VEETID) 500 MG tablet Take 1 tablet (500 mg total) by mouth 2 (two) times daily for 10 days. 20 tablet Harrie Foreman, MD     PDMP not reviewed this encounter.   Harrie Foreman, MD 06/16/19 [phone]

## 2021-07-12 ENCOUNTER — Ambulatory Visit
Admission: EM | Admit: 2021-07-12 | Discharge: 2021-07-12 | Disposition: A | Discharge status: Home / Self Care | Payer: Managed Care, Other (non HMO)

## 2021-07-12 DIAGNOSIS — J069 Acute upper respiratory infection, unspecified: Secondary | ICD-10-CM | POA: Diagnosis not present

## 2021-07-12 LAB — POCT INFLUENZA A/B
Influenza A, POC: NEGATIVE
Influenza B, POC: NEGATIVE

## 2021-07-12 NOTE — ED Provider Notes (Signed)
[facility]    CSN: [id number] Arrival date & time: 07/12/21  1617      History   Chief Complaint Chief Complaint  Patient presents with   Fever   Generalized Body Aches    HPI KRISI AZUA is a 25 y.o. female.   OffPatient here today for evaluation of fever, congestion, and diarrhea that started 2 days ago.  She has had some nausea but no vomiting.  She has tried taking over-the-counter treatment with some relief of fever.   Fever Associated symptoms: chills, congestion, cough, diarrhea, nausea and sore throat   Associated symptoms: no ear pain and no vomiting    History reviewed. No pertinent past medical history.  Patient Active Problem List   Diagnosis Date Noted   Left ankle pain 10/21/2018    Past Surgical History:  Procedure Laterality Date   WISDOM TOOTH EXTRACTION      OB History   No obstetric history on file.      Home Medications    Prior to Admission medications   Medication Sig Start Date End Date Taking? Authorizing Provider  norethindrone-ethinyl estradiol-FE (LOESTRIN FE) 1-20 MG-MCG tablet Take 1 tablet by mouth daily. 03/25/21  Yes [provider]    Family History Family History  Problem Relation Age of Onset   Healthy Mother    Healthy Father     Social History Social History   Tobacco Use   Smoking status: Never   Smokeless tobacco: Never  Vaping Use   Vaping Use: Never used  Substance Use Topics   Alcohol use: No   Drug use: No     Allergies   Patient has no known allergies.   Review of Systems Review of Systems  Constitutional:  Positive for chills and fever.  HENT:  Positive for congestion, sinus pressure and sore throat. Negative for ear pain.   Eyes:  Negative for discharge and redness.  Respiratory:  Positive for cough. Negative for shortness of breath and wheezing.   Gastrointestinal:  Positive for diarrhea and nausea. Negative for abdominal pain and vomiting.    Physical  Exam Triage Vital Signs ED Triage Vitals  Enc Vitals Group     BP 07/12/21 1700 116/86     Pulse Rate 07/12/21 1700 88     Resp 07/12/21 1700 18     Temp 07/12/21 1700 98.8 F (37.1 C)     Temp Source 07/12/21 1700 Oral     SpO2 07/12/21 1700 96 %     Weight --      Height --      Head Circumference --      Peak Flow --      Pain Score 07/12/21 1702 7     Pain Loc --      Pain Edu? --      Excl. in GC? --    No data found.  Updated Vital Signs BP 116/86 (BP Location: Right Arm)   Pulse 88   Temp 98.8 F (37.1 C) (Oral)   Resp 18   LMP 06/30/2021 (Approximate)   SpO2 96%      Physical Exam Vitals and nursing note reviewed.  Constitutional:      General: She is not in acute distress.    Appearance: Normal appearance. She is not ill-appearing.  HENT:     Head: Normocephalic and atraumatic.     Nose: Congestion present.     Mouth/Throat:     Mouth: Mucous membranes are moist.  Pharynx: No oropharyngeal exudate or posterior oropharyngeal erythema.  Eyes:     Conjunctiva/sclera: Conjunctivae normal.  Cardiovascular:     Rate and Rhythm: Normal rate and regular rhythm.     Heart sounds: Normal heart sounds. No murmur heard. Pulmonary:     Effort: Pulmonary effort is normal. No respiratory distress.     Breath sounds: Normal breath sounds. No wheezing, rhonchi or rales.  Skin:    General: Skin is warm and dry.  Neurological:     Mental Status: She is alert.  Psychiatric:        Mood and Affect: Mood normal.        Thought Content: Thought content normal.     UC Treatments / Results  Labs (all labs ordered are listed, but only abnormal results are displayed) Labs Reviewed  COVID-19, FLU A+B NAA  POCT INFLUENZA A/B    EKG   Radiology No results found.  Procedures Procedures (including critical care time)  Medications Ordered in UC Medications - No data to display  Initial Impression / Assessment and Plan / UC Course  I have reviewed the  triage vital signs and the nursing notes.  Pertinent labs & imaging results that were available during my care of the patient were reviewed by me and considered in my medical decision making (see chart for details).  Rapid flu test negative in office.  Will order COVID, flu screening with PCR.  Suspect likely viral etiology of symptoms and recommended symptomatic treatment in the meantime.  Encouraged follow-up with any further concerns.  Final Clinical Impressions(s) / UC Diagnoses   Final diagnoses:  Acute upper respiratory infection   Discharge Instructions   None    ED Prescriptions   None    PDMP not reviewed this encounter.   Tomi Bamberger, PA-C 07/12/21 1742

## 2021-07-12 NOTE — ED Triage Notes (Signed)
2 day h/o cough, sore throat, fever and body aches. Tmax 101. Has been advil with decrease in fever. Confirms diarrhea and nausea. No emesis. Pt is covid vaccinated but not boosted.

## 2021-07-13 LAB — COVID-19, FLU A+B NAA
Influenza A, NAA: NOT DETECTED
Influenza B, NAA: NOT DETECTED
SARS-CoV-2, NAA: DETECTED — AB

## 2021-11-10 ENCOUNTER — Ambulatory Visit: Admit: 2021-11-10 | Discharge status: Absent without leave | Payer: Self-pay

## 2021-11-10 ENCOUNTER — Emergency Department (HOSPITAL_COMMUNITY): Payer: BC Managed Care – PPO

## 2021-11-10 ENCOUNTER — Encounter (HOSPITAL_COMMUNITY): Payer: Self-pay | Admitting: Emergency Medicine

## 2021-11-10 ENCOUNTER — Other Ambulatory Visit: Payer: Self-pay

## 2021-11-10 ENCOUNTER — Inpatient Hospital Stay (HOSPITAL_COMMUNITY)
Admission: EM | Admit: 2021-11-10 | Discharge: 2021-11-13 | DRG: 866 | Disposition: A | Discharge status: Home / Self Care | Payer: BC Managed Care – PPO | Attending: Internal Medicine | Admitting: Internal Medicine

## 2021-11-10 ENCOUNTER — Ambulatory Visit (INDEPENDENT_AMBULATORY_CARE_PROVIDER_SITE_OTHER)
Admission: EM | Admit: 2021-11-10 | Discharge: 2021-11-10 | Disposition: A | Discharge status: Home / Self Care | Payer: BC Managed Care – PPO | Source: Home / Self Care

## 2021-11-10 DIAGNOSIS — R6510 Systemic inflammatory response syndrome (SIRS) of non-infectious origin without acute organ dysfunction: Secondary | ICD-10-CM

## 2021-11-10 DIAGNOSIS — E861 Hypovolemia: Secondary | ICD-10-CM | POA: Diagnosis present

## 2021-11-10 DIAGNOSIS — R1011 Right upper quadrant pain: Secondary | ICD-10-CM | POA: Insufficient documentation

## 2021-11-10 DIAGNOSIS — Z793 Long term (current) use of hormonal contraceptives: Secondary | ICD-10-CM

## 2021-11-10 DIAGNOSIS — D649 Anemia, unspecified: Secondary | ICD-10-CM | POA: Diagnosis present

## 2021-11-10 DIAGNOSIS — B349 Viral infection, unspecified: Principal | ICD-10-CM | POA: Diagnosis present

## 2021-11-10 DIAGNOSIS — K828 Other specified diseases of gallbladder: Secondary | ICD-10-CM | POA: Diagnosis present

## 2021-11-10 DIAGNOSIS — E860 Dehydration: Secondary | ICD-10-CM | POA: Diagnosis present

## 2021-11-10 DIAGNOSIS — E871 Hypo-osmolality and hyponatremia: Secondary | ICD-10-CM | POA: Diagnosis present

## 2021-11-10 DIAGNOSIS — Z6826 Body mass index (BMI) 26.0-26.9, adult: Secondary | ICD-10-CM | POA: Diagnosis not present

## 2021-11-10 DIAGNOSIS — R509 Fever, unspecified: Secondary | ICD-10-CM | POA: Insufficient documentation

## 2021-11-10 DIAGNOSIS — Z20822 Contact with and (suspected) exposure to covid-19: Secondary | ICD-10-CM | POA: Diagnosis present

## 2021-11-10 DIAGNOSIS — A419 Sepsis, unspecified organism: Secondary | ICD-10-CM

## 2021-11-10 LAB — COMPREHENSIVE METABOLIC PANEL
ALT: 11 U/L (ref 0–44)
AST: 16 U/L (ref 15–41)
Albumin: 3.9 g/dL (ref 3.5–5.0)
Alkaline Phosphatase: 38 U/L (ref 38–126)
Anion gap: 10 (ref 5–15)
BUN: 7 mg/dL (ref 6–20)
CO2: 23 mmol/L (ref 22–32)
Calcium: 9.5 mg/dL (ref 8.9–10.3)
Chloride: 99 mmol/L (ref 98–111)
Creatinine, Ser: 0.82 mg/dL (ref 0.44–1.00)
GFR, Estimated: >60 mL/min (ref >60)
Glucose, Bld: 103 mg/dL — ABNORMAL HIGH (ref 70–99)
Potassium: 4.1 mmol/L (ref 3.5–5.1)
Sodium: 132 mmol/L — ABNORMAL LOW (ref 135–145)
Total Bilirubin: 0.3 mg/dL (ref 0.3–1.2)
Total Protein: 7.8 g/dL (ref 6.5–8.1)

## 2021-11-10 LAB — POCT INFLUENZA A/B
Influenza A, POC: NEGATIVE
Influenza B, POC: NEGATIVE

## 2021-11-10 LAB — URINALYSIS, ROUTINE W REFLEX MICROSCOPIC
Bilirubin Urine: NEGATIVE
Glucose, UA: NEGATIVE mg/dL
Hgb urine dipstick: NEGATIVE
Ketones, ur: 80 mg/dL — AB
Leukocytes,Ua: NEGATIVE
Nitrite: NEGATIVE
Protein, ur: NEGATIVE mg/dL
Specific Gravity, Urine: 1.017 (ref 1.005–1.030)
pH: 6 (ref 5.0–8.0)

## 2021-11-10 LAB — LACTIC ACID, PLASMA
Lactic Acid, Venous: 1 mmol/L (ref 0.5–1.9)
Lactic Acid, Venous: 1.1 mmol/L (ref 0.5–1.9)

## 2021-11-10 LAB — CBC
HCT: 40.2 % (ref 36.0–46.0)
Hemoglobin: 14 g/dL (ref 12.0–15.0)
MCH: 29.8 pg (ref 26.0–34.0)
MCHC: 34.8 g/dL (ref 30.0–36.0)
MCV: 85.5 fL (ref 80.0–100.0)
Platelets: 191 10*3/uL (ref 150–400)
RBC: 4.7 MIL/uL (ref 3.87–5.11)
RDW: 11.3 % — ABNORMAL LOW (ref 11.5–15.5)
WBC: 7.4 10*3/uL (ref 4.0–10.5)
nRBC: 0 % (ref 0.0–0.2)

## 2021-11-10 LAB — I-STAT BETA HCG BLOOD, ED (MC, WL, AP ONLY): I-stat hCG, quantitative: <5 m[IU]/mL (ref <5)

## 2021-11-10 LAB — LIPASE, BLOOD: Lipase: 27 U/L (ref 11–51)

## 2021-11-10 LAB — RESP PANEL BY RT-PCR (FLU A&B, COVID) ARPGX2
Influenza A by PCR: NEGATIVE
Influenza B by PCR: NEGATIVE
SARS Coronavirus 2 by RT PCR: NEGATIVE

## 2021-11-10 LAB — PROTIME-INR
INR: 1 (ref 0.8–1.2)
Prothrombin Time: 13.2 seconds (ref 11.4–15.2)

## 2021-11-10 LAB — APTT: aPTT: 32 seconds (ref 24–36)

## 2021-11-10 LAB — POCT RAPID STREP A (OFFICE): Rapid Strep A Screen: NEGATIVE

## 2021-11-10 MED ORDER — CEFTRIAXONE SODIUM 1 G IJ SOLR: 1.0000 g | INTRAMUSCULAR | Status: DC

## 2021-11-10 MED ORDER — FENTANYL CITRATE PF 50 MCG/ML IJ SOSY
50.0000 ug | PREFILLED_SYRINGE | Freq: Once | INTRAMUSCULAR | Status: AC
  Administered 2021-11-10: 50 ug via INTRAVENOUS
  Filled 2021-11-10: qty 1

## 2021-11-10 MED ORDER — SODIUM CHLORIDE 0.9 % IV SOLN: INTRAVENOUS | Status: DC

## 2021-11-10 MED ORDER — VANCOMYCIN HCL 1500 MG/300ML IV SOLN
1500.0000 mg | Freq: Once | INTRAVENOUS | Status: AC
Start: 2021-11-10 — End: 2021-11-10
  Administered 2021-11-10: 1500 mg via INTRAVENOUS
  Filled 2021-11-10: qty 300

## 2021-11-10 MED ORDER — ACETAMINOPHEN 650 MG RE SUPP: 650.0000 mg | Freq: Four times a day (QID) | RECTAL | Status: DC | PRN

## 2021-11-10 MED ORDER — LACTATED RINGERS IV SOLN: INTRAVENOUS | Status: AC

## 2021-11-10 MED ORDER — VANCOMYCIN HCL IN DEXTROSE 1-5 GM/200ML-% IV SOLN: 1000.0000 mg | Freq: Once | INTRAVENOUS | Status: DC

## 2021-11-10 MED ORDER — LACTATED RINGERS IV BOLUS (SEPSIS)
500.0000 mL | Freq: Once | INTRAVENOUS | Status: AC
  Administered 2021-11-10: 500 mL via INTRAVENOUS

## 2021-11-10 MED ORDER — ACETAMINOPHEN 500 MG PO TABS
1000.0000 mg | ORAL_TABLET | Freq: Once | ORAL | Status: AC
  Administered 2021-11-10: 1000 mg via ORAL

## 2021-11-10 MED ORDER — ONDANSETRON HCL 4 MG/2ML IJ SOLN
4.0000 mg | Freq: Four times a day (QID) | INTRAMUSCULAR | Status: DC | PRN
Start: 2021-11-10 — End: 2021-11-13
  Administered 2021-11-11 – 2021-11-13 (×3): 4 mg via INTRAVENOUS
  Filled 2021-11-10 (×3): qty 2

## 2021-11-10 MED ORDER — MAGNESIUM HYDROXIDE 400 MG/5ML PO SUSP: 30.0000 mL | Freq: Every day | ORAL | Status: DC | PRN

## 2021-11-10 MED ORDER — NORETHIN ACE-ETH ESTRAD-FE 1-20 MG-MCG PO TABS
1.0000 | ORAL_TABLET | Freq: Every day | ORAL | Status: DC
  Administered 2021-11-11 – 2021-11-13 (×3): 1 via ORAL

## 2021-11-10 MED ORDER — METRONIDAZOLE 500 MG/100ML IV SOLN
500.0000 mg | Freq: Three times a day (TID) | INTRAVENOUS | Status: DC
  Administered 2021-11-11 – 2021-11-13 (×8): 500 mg via INTRAVENOUS
  Filled 2021-11-10 (×8): qty 100

## 2021-11-10 MED ORDER — ALUM & MAG HYDROXIDE-SIMETH 200-200-20 MG/5ML PO SUSP
30.0000 mL | Freq: Once | ORAL | Status: AC
  Administered 2021-11-10: 30 mL via ORAL
  Filled 2021-11-10: qty 30

## 2021-11-10 MED ORDER — SODIUM CHLORIDE 0.9 % IV SOLN
2.0000 g | Freq: Once | INTRAVENOUS | Status: AC
  Administered 2021-11-10: 2 g via INTRAVENOUS
  Filled 2021-11-10: qty 20

## 2021-11-10 MED ORDER — ONDANSETRON HCL 4 MG PO TABS: 4.0000 mg | ORAL_TABLET | Freq: Four times a day (QID) | ORAL | Status: DC | PRN

## 2021-11-10 MED ORDER — METRONIDAZOLE 500 MG/100ML IV SOLN
500.0000 mg | Freq: Once | INTRAVENOUS | Status: AC
  Administered 2021-11-10: 500 mg via INTRAVENOUS
  Filled 2021-11-10: qty 100

## 2021-11-10 MED ORDER — ACETAMINOPHEN 500 MG PO TABS
1000.0000 mg | ORAL_TABLET | Freq: Once | ORAL | Status: AC
Start: 2021-11-10 — End: 2021-11-10
  Administered 2021-11-10: 1000 mg via ORAL
  Filled 2021-11-10: qty 2

## 2021-11-10 MED ORDER — IOHEXOL 300 MG/ML  SOLN
100.0000 mL | Freq: Once | INTRAMUSCULAR | Status: AC | PRN
  Administered 2021-11-10: 100 mL via INTRAVENOUS

## 2021-11-10 MED ORDER — TRAZODONE HCL 50 MG PO TABS
25.0000 mg | ORAL_TABLET | Freq: Every evening | ORAL | Status: DC | PRN
  Administered 2021-11-11 – 2021-11-12 (×2): 25 mg via ORAL
  Filled 2021-11-10 (×2): qty 1

## 2021-11-10 MED ORDER — LACTATED RINGERS IV BOLUS (SEPSIS)
250.0000 mL | Freq: Once | INTRAVENOUS | Status: AC
  Administered 2021-11-10: 250 mL via INTRAVENOUS

## 2021-11-10 MED ORDER — ENOXAPARIN SODIUM 40 MG/0.4ML IJ SOSY
40.0000 mg | PREFILLED_SYRINGE | INTRAMUSCULAR | Status: DC
  Administered 2021-11-11 – 2021-11-13 (×3): 40 mg via SUBCUTANEOUS
  Filled 2021-11-10 (×3): qty 0.4

## 2021-11-10 MED ORDER — VANCOMYCIN HCL IN DEXTROSE 1-5 GM/200ML-% IV SOLN
1000.0000 mg | Freq: Two times a day (BID) | INTRAVENOUS | Status: DC
  Administered 2021-11-11 – 2021-11-12 (×3): 1000 mg via INTRAVENOUS
  Filled 2021-11-10 (×4): qty 200

## 2021-11-10 MED ORDER — LACTATED RINGERS IV BOLUS (SEPSIS)
1000.0000 mL | Freq: Once | INTRAVENOUS | Status: AC
  Administered 2021-11-10: 1000 mL via INTRAVENOUS

## 2021-11-10 MED ORDER — LIDOCAINE VISCOUS HCL 2 % MT SOLN
15.0000 mL | Freq: Once | OROMUCOSAL | Status: AC
  Administered 2021-11-10: 15 mL via ORAL
  Filled 2021-11-10: qty 15

## 2021-11-10 MED ORDER — ACETAMINOPHEN 325 MG PO TABS
650.0000 mg | ORAL_TABLET | Freq: Four times a day (QID) | ORAL | Status: DC | PRN
  Administered 2021-11-11 – 2021-11-12 (×2): 650 mg via ORAL
  Filled 2021-11-10 (×2): qty 2

## 2021-11-10 NOTE — ED Notes (Signed)
Gi at the bedside

## 2021-11-10 NOTE — Consult Note (Signed)
Reason for Consult: RUQ pain and fever ?Referring Physician: [facility] ? ?[name] ?HPI: This is a 26 year old female without any significant PMH presents to the ER with severe RUQ pain and fever.  Her symptoms started acutely last Tuesday/Wednesday.  She had fever up to 101 F and RUQ pain.  She sought care through an urgent care and there was the presumption of a biliary source.  The patient denies any use of new medications, herbal supplements, nausea, vomiting, diarrhea, or constipation.  She denies having this type of pain before.  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  The students were reported to have abdominal pain and fever, but it is not necessarily the same symptoms as she is currently experiencing.  In the ER she was documented to have a fever at 100 F, but this is after Tylenol was administered at the urgent care.  She tried self-medicating with Tylenol and ibuprofen with no effect at home.  Her blood work in the ER was negative for any abnormalities and the RUQ U/S reveals some indeterminate findings.  The is the suggestion of a cholangitis, but her biliary system is not dilated and she does not have any evidence of stones. ? ?History reviewed. No pertinent past medical history. ? ?Past Surgical History:  ?Procedure Laterality Date  ? WISDOM TOOTH EXTRACTION    ? ? ?Family History  ?Problem Relation Age of Onset  ? Healthy Mother   ? Healthy Father   ? ? ?Social History:  reports that she has never smoked. She has never used smokeless tobacco. She reports that she does not drink alcohol and does not use drugs. ? ?Allergies: No Known Allergies ? ?Medications: Scheduled: ?Continuous: ? lactated ringers 150 mL/hr at 11/10/21 1736  ? [START ON 11/11/2021] vancomycin    ? vancomycin 1,500 mg (11/10/21 1934)  ? ? ?Results for orders placed or performed during the hospital encounter of 11/10/21 (from the past 24 hour(s))  ?Lipase, blood     Status: None  ? Collection  Time: 11/10/21  2:37 PM  ?Result Value Ref Range  ? Lipase 27 11 - 51 U/L  ?Comprehensive metabolic panel     Status: Abnormal  ? Collection Time: 11/10/21  2:37 PM  ?Result Value Ref Range  ? Sodium 132 (L) 135 - 145 mmol/L  ? Potassium 4.1 3.5 - 5.1 mmol/L  ? Chloride 99 98 - 111 mmol/L  ? CO2 23 22 - 32 mmol/L  ? Glucose, Bld 103 (H) 70 - 99 mg/dL  ? BUN 7 6 - 20 mg/dL  ? Creatinine, Ser 0.82 0.44 - 1.00 mg/dL  ? Calcium 9.5 8.9 - 10.3 mg/dL  ? Total Protein 7.8 6.5 - 8.1 g/dL  ? Albumin 3.9 3.5 - 5.0 g/dL  ? AST 16 15 - 41 U/L  ? ALT 11 0 - 44 U/L  ? Alkaline Phosphatase 38 38 - 126 U/L  ? Total Bilirubin 0.3 0.3 - 1.2 mg/dL  ? GFR, Estimated >60 >60 mL/min  ? Anion gap 10 5 - 15  ?CBC     Status: Abnormal  ? Collection Time: 11/10/21  2:37 PM  ?Result Value Ref Range  ? WBC 7.4 4.0 - 10.5 K/uL  ? RBC 4.70 3.87 - 5.11 MIL/uL  ? Hemoglobin 14.0 12.0 - 15.0 g/dL  ? HCT 40.2 36.0 - 46.0 %  ? MCV 85.5 80.0 - 100.0 fL  ? MCH 29.8 26.0 - 34.0 pg  ?  MCHC 34.8 30.0 - 36.0 g/dL  ? RDW 11.3 (L) 11.5 - 15.5 %  ? Platelets 191 150 - 400 K/uL  ? nRBC 0.0 0.0 - 0.2 %  ?Lactic acid, plasma     Status: None  ? Collection Time: 11/10/21  2:40 PM  ?Result Value Ref Range  ? Lactic Acid, Venous 1.0 0.5 - 1.9 mmol/L  ?I-Stat beta hCG blood, ED     Status: None  ? Collection Time: 11/10/21  2:50 PM  ?Result Value Ref Range  ? I-stat hCG, quantitative <5.0 <5 mIU/mL  ? Comment 3          ?Lactic acid, plasma     Status: None  ? Collection Time: 11/10/21  3:47 PM  ?Result Value Ref Range  ? Lactic Acid, Venous 1.1 0.5 - 1.9 mmol/L  ?Protime-INR     Status: None  ? Collection Time: 11/10/21  4:13 PM  ?Result Value Ref Range  ? Prothrombin Time 13.2 11.4 - 15.2 seconds  ? INR 1.0 0.8 - 1.2  ?APTT     Status: None  ? Collection Time: 11/10/21  4:13 PM  ?Result Value Ref Range  ? aPTT 32 24 - 36 seconds  ?Resp Panel by RT-PCR (Flu A&B, Covid) Nasopharyngeal Swab     Status: None  ? Collection Time: 11/10/21  7:10 PM  ? Specimen:  Nasopharyngeal Swab; Nasopharyngeal(NP) swabs in vial transport medium  ?Result Value Ref Range  ? SARS Coronavirus 2 by RT PCR NEGATIVE NEGATIVE  ? Influenza A by PCR NEGATIVE NEGATIVE  ? Influenza B by PCR NEGATIVE NEGATIVE  ?  ? ?DG Chest Port 1 View ? ?Result Date: 11/10/2021 ?CLINICAL DATA:  Questionable sepsis - evaluate for abnormality EXAM: PORTABLE CHEST 1 VIEW COMPARISON:  None. FINDINGS: The cardiomediastinal silhouette is normal in contour. No pleural effusion. No pneumothorax. No acute pleuroparenchymal abnormality. Visualized abdomen is unremarkable. No acute osseous abnormality noted. IMPRESSION: No acute cardiopulmonary abnormality. Electronically Signed   By: Meda Klinefelter M.D.   On: 11/10/2021 16:00  ? ?US Abdomen Limited RUQ (LIVER/GB) ? ?Result Date: 11/10/2021 ?CLINICAL DATA:  Right upper quadrant pain, sepsis. EXAM: ULTRASOUND ABDOMEN LIMITED RIGHT UPPER QUADRANT COMPARISON:  Right upper quadrant ultrasound 10/13/2017. FINDINGS: Gallbladder: No gallstones or wall thickening visualized. No sonographic Murphy sign noted by sonographer. Common bile duct: Diameter: 4.0 mm. Liver: No focal lesion identified. Within normal limits in parenchymal echogenicity. Portal triad walls appear slightly echogenic. No ring down artifact. Portal vein is patent on color Doppler imaging with normal direction of blood flow towards the liver. Other: None. IMPRESSION: 1. No cholelithiasis. 2. Portal triad walls appear echogenic. Findings are indeterminate, but can be seen in the setting of cholangitis. Pneumobilia is less likely given that there is no ring down artifact. Consider further evaluation with CT or MRI. Electronically Signed   By: Darliss Cheney M.D.   On: 11/10/2021 16:41   ? ?ROS:  As stated above in the HPI otherwise negative. ? ?Blood pressure 120/71, pulse 96, temperature (!) 100.8 ?F (38.2 ?C), resp. rate (!) 21, last menstrual period 10/13/2021, SpO2 100 %.   ? ?PE: ?Gen: Uncomfortable, Alert and  Oriented ?HEENT:  [place]/AT, EOMI ?Neck: Supple, no LAD ?Lungs: CTA Bilaterally ?CV: RRR without M/G/R, tachycardic ?ABD: Soft, tender in the RUQ, +BS ?Ext: No C/C/E ? ?Assessment/Plan: ?1) RUQ pain. ?2) Fever. ?3) Generalized body aches. ? ? Her clinical presentation is consistent with a biliary source, but there is no evidence of cholecystitis,  calculous or acalculous.  Her blood work is normal.  A CT scan is pending and hopefully this will shed some light on her current symptoms.  This can also be viral in etiology. ? ?Plan: ?1) Await CT scan. ?2) Agree with antibiotics. ?3) Continue with IV hydration and other supportive measures. ? ?Sarath Privott D ?11/10/2021, 8:46 PM  ? ? ?  ?

## 2021-11-10 NOTE — ED Triage Notes (Signed)
5 day h/o body aches, congestion, bilateral ears pain and abdominal pain that has worsened since the onset. Pt reports intermittent fever for the last 4 days. No v/d. Confirms nausea. Notes decreased appetite. Has been taking advil and tylenol w/o relief. 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 Husband with stomach virus approx 2 week ago. ?

## 2021-11-10 NOTE — Progress Notes (Signed)
Pharmacy Antibiotic Note ? ?Judith Nguyen is a 26 y.o. female admitted on 11/10/2021 presenting with RUQ pain and nausea, fever, concern for sepsis.  Pharmacy has been consulted for vancomycin dosing.  Ceftriaxone/flagyl per MD ? ?Plan: ?Vancomycin 1500 mg IV x 1, then 1000 mg IV q 12h (eAUC 453, Goal AUC 400-550, SCr 0.82) ?Monitor renal function, Cx and clinical progression to narrow ?Vancomycin levels as needed ? ?  ? ?Temp (24hrs), Avg:101.3 ?F (38.5 ?C), Min:100.7 ?F (38.2 ?C), Max:101.9 ?F (38.8 ?C) ? ?Recent Labs  ?Lab 11/10/21 ?1437 11/10/21 ?1440 11/10/21 ?1547  ?WBC 7.4  --   --   ?CREATININE 0.82  --   --   ?LATICACIDVEN  --  1.0 1.1  ?  ?CrCl cannot be calculated (Unknown ideal weight.).   ? ?No Known Allergies ? ?Daylene Posey, PharmD ?Clinical Pharmacist ?ED Pharmacist Phone # [phone] ?11/10/2021 4:47 PM ? ? ?

## 2021-11-10 NOTE — ED Notes (Signed)
Stuck pt before other lab where ordered. ?

## 2021-11-10 NOTE — ED Provider Notes (Addendum)
[facility] EMERGENCY DEPARTMENT Provider Note   CSN: [id number] Arrival date & time: 11/10/21  1430     History  Chief Complaint  Patient presents with   Abdominal Pain    Judith Nguyen is a 26 y.o. female.   Abdominal Pain Associated symptoms: fever and nausea     26 year old female presenting to the emergency department with right upper quadrant abdominal pain, nausea, fever at home since Tuesday.  The patient denies any vomiting or diarrhea or dysuria.  She denies any flank pain.  She states that symptoms have been ongoing since Tuesday.  Tmax at home was 101.  She endorses right upper quadrant pain radiating to her back.  She denies any diarrhea, vomiting, constipation.   Home Medications Prior to Admission medications   Medication Sig Start Date End Date Taking? Authorizing Provider  norethindrone-ethinyl estradiol-FE (LOESTRIN FE) 1-20 MG-MCG tablet Take 1 tablet by mouth daily. 03/25/21  Yes [provider]      Allergies    Patient has no known allergies.    Review of Systems   Review of Systems  Constitutional:  Positive for fever.  Gastrointestinal:  Positive for abdominal pain and nausea.  All other systems reviewed and are negative.  Physical Exam Updated Vital Signs BP 116/70    Pulse (!) 106    Temp (!) 100.8 F (38.2 C)    Resp 14    LMP 10/13/2021    SpO2 98%  Physical Exam Vitals and nursing note reviewed.  Constitutional:      General: She is not in acute distress.    Appearance: She is well-developed.  HENT:     Head: Normocephalic and atraumatic.  Eyes:     Conjunctiva/sclera: Conjunctivae normal.  Cardiovascular:     Rate and Rhythm: Normal rate and regular rhythm.     Heart sounds: No murmur heard. Pulmonary:     Effort: Pulmonary effort is normal. No respiratory distress.     Breath sounds: Normal breath sounds.  Abdominal:     Palpations: Abdomen is soft.     Tenderness: There is abdominal tenderness in the  right upper quadrant and epigastric area. There is no guarding.  Musculoskeletal:        General: No swelling.     Cervical back: Neck supple.  Skin:    General: Skin is warm and dry.     Capillary Refill: Capillary refill takes less than 2 seconds.  Neurological:     Mental Status: She is alert.  Psychiatric:        Mood and Affect: Mood normal.    ED Results / Procedures / Treatments   Labs (all labs ordered are listed, but only abnormal results are displayed) Labs Reviewed  COMPREHENSIVE METABOLIC PANEL - Abnormal; Notable for the following components:      Result Value   Sodium 132 (*)    Glucose, Bld 103 (*)    All other components within normal limits  CBC - Abnormal; Notable for the following components:   RDW 11.3 (*)    All other components within normal limits  URINALYSIS, ROUTINE W REFLEX MICROSCOPIC - Abnormal; Notable for the following components:   Color, Urine STRAW (*)    Ketones, ur 80 (*)    All other components within normal limits  RESP PANEL BY RT-PCR (FLU A&B, COVID) ARPGX2  CULTURE, BLOOD (ROUTINE X 2)  CULTURE, BLOOD (ROUTINE X 2)  URINE CULTURE  LIPASE, BLOOD  LACTIC ACID, PLASMA  LACTIC ACID, PLASMA  PROTIME-INR  APTT  I-STAT BETA HCG BLOOD, ED ([facility], [facility], [facility] ONLY)    EKG EKG Interpretation  Date/Time:  Sunday November 10 2021 17:00:14 EST Ventricular Rate:  95 PR Interval:  166 QRS Duration: 102 QT Interval:  338 QTC Calculation: 425 R Axis:   85 Text Interpretation: Sinus rhythm Borderline T wave abnormalities Confirmed by Ernie Avena ([id number]) on 11/10/2021 5:11:07 PM  Radiology CT ABDOMEN PELVIS W CONTRAST  Result Date: 11/10/2021 CLINICAL DATA:  Right lower quadrant pain. EXAM: CT ABDOMEN AND PELVIS WITH CONTRAST TECHNIQUE: Multidetector CT imaging of the abdomen and pelvis was performed using the standard protocol following bolus administration of intravenous contrast. RADIATION DOSE REDUCTION: This exam was performed according to the  departmental dose-optimization program which includes automated exposure control, adjustment of the mA and/or kV according to patient size and/or use of iterative reconstruction technique. CONTRAST:  OMNIPAQUE IOHEXOL 300 MG/ML  SOLN COMPARISON:  Ultrasound abdomen 01/08/2022. FINDINGS: Lower chest: No acute abnormality. Hepatobiliary: No focal liver abnormality is seen. No gallstones, gallbladder wall thickening, or biliary dilatation. Pancreas: Unremarkable. No pancreatic ductal dilatation or surrounding inflammatory changes. Spleen: Normal in size without focal abnormality. Adrenals/Urinary Tract: Adrenal glands are unremarkable. Kidneys are normal, without renal calculi, focal lesion, or hydronephrosis. Bladder is unremarkable. Stomach/Bowel: Stomach is within normal limits. Appendix not definitely visualized. There are no inflammatory changes surrounding the cecum. No evidence of bowel wall thickening, distention, or inflammatory changes. Vascular/Lymphatic: No significant vascular findings are present. No enlarged abdominal or pelvic lymph nodes. Reproductive: Uterus and bilateral adnexa are unremarkable. Other: There is a small amount of free fluid in the pelvis. There is no focal abdominal wall hernia or free air. Musculoskeletal: No acute or significant osseous findings. IMPRESSION: 1. Small amount of free fluid in the pelvis. 2. Appendix not definitely visualized. No inflammatory change in the right lower quadrant. Electronically Signed   By: Darliss Cheney M.D.   On: 11/10/2021 21:03   DG Chest Port 1 View  Result Date: 11/10/2021 CLINICAL DATA:  Questionable sepsis - evaluate for abnormality EXAM: PORTABLE CHEST 1 VIEW COMPARISON:  None. FINDINGS: The cardiomediastinal silhouette is normal in contour. No pleural effusion. No pneumothorax. No acute pleuroparenchymal abnormality. Visualized abdomen is unremarkable. No acute osseous abnormality noted. IMPRESSION: No acute cardiopulmonary  abnormality. Electronically Signed   By: Meda Klinefelter M.D.   On: 11/10/2021 16:00   US Abdomen Limited RUQ (LIVER/GB)  Result Date: 11/10/2021 CLINICAL DATA:  Right upper quadrant pain, sepsis. EXAM: ULTRASOUND ABDOMEN LIMITED RIGHT UPPER QUADRANT COMPARISON:  Right upper quadrant ultrasound 10/13/2017. FINDINGS: Gallbladder: No gallstones or wall thickening visualized. No sonographic Murphy sign noted by sonographer. Common bile duct: Diameter: 4.0 mm. Liver: No focal lesion identified. Within normal limits in parenchymal echogenicity. Portal triad walls appear slightly echogenic. No ring down artifact. Portal vein is patent on color Doppler imaging with normal direction of blood flow towards the liver. Other: None. IMPRESSION: 1. No cholelithiasis. 2. Portal triad walls appear echogenic. Findings are indeterminate, but can be seen in the setting of cholangitis. Pneumobilia is less likely given that there is no ring down artifact. Consider further evaluation with CT or MRI. Electronically Signed   By: Darliss Cheney M.D.   On: 11/10/2021 16:41    Procedures .Critical Care Performed by: Ernie Avena, MD Authorized by: Ernie Avena, MD   Critical care provider statement:    Critical care time (minutes):  38   Critical care was necessary to  treat or prevent imminent or life-threatening deterioration of the following conditions:  Sepsis   Critical care was time spent personally by me on the following activities:  Development of treatment plan with patient or surrogate, ordering and performing treatments and interventions, ordering and review of laboratory studies, ordering and review of radiographic studies, pulse oximetry, re-evaluation of patient's condition, review of old charts, obtaining history from patient or surrogate, discussions with consultants, evaluation of patient's response to treatment and examination of patient   Care discussed with: admitting provider      Medications Ordered  in ED Medications  lactated ringers infusion (0 mLs Intravenous Stopped 11/10/21 2203)  vancomycin (VANCOCIN) IVPB 1000 mg/200 mL premix (has no administration in time range)  lactated ringers bolus 1,000 mL (0 mLs Intravenous Stopped 11/10/21 1845)    And  lactated ringers bolus 500 mL (0 mLs Intravenous Stopped 11/10/21 1934)    And  lactated ringers bolus 250 mL (0 mLs Intravenous Stopped 11/10/21 2044)  cefTRIAXone (ROCEPHIN) 2 g in sodium chloride 0.9 % 100 mL IVPB (0 g Intravenous Stopped 11/10/21 1752)  metroNIDAZOLE (FLAGYL) IVPB 500 mg (0 mg Intravenous Stopped 11/10/21 1851)  fentaNYL (SUBLIMAZE) injection 50 mcg (50 mcg Intravenous Given 11/10/21 1722)  vancomycin (VANCOREADY) IVPB 1500 mg/300 mL (0 mg Intravenous Stopped 11/10/21 2203)  acetaminophen (TYLENOL) tablet 1,000 mg (1,000 mg Oral Given 11/10/21 2043)  fentaNYL (SUBLIMAZE) injection 50 mcg (50 mcg Intravenous Given 11/10/21 2031)  iohexol (OMNIPAQUE) 300 MG/ML solution 100 mL (100 mLs Intravenous Contrast Given 11/10/21 2054)  alum & mag hydroxide-simeth (MAALOX/MYLANTA) 200-200-20 MG/5ML suspension 30 mL (30 mLs Oral Given 11/10/21 2212)    And  lidocaine (XYLOCAINE) 2 % viscous mouth solution 15 mL (15 mLs Oral Given 11/10/21 2212)    ED Course/ Medical Decision Making/ A&P                           Medical Decision Making Amount and/or Complexity of Data Reviewed Labs: ordered. Radiology: ordered. ECG/medicine tests: ordered.  Risk OTC drugs. Prescription drug management. Decision regarding hospitalization.   26 year old female presenting to the emergency department with right upper quadrant abdominal pain, nausea, fever at home since Tuesday.  The patient denies any vomiting or diarrhea or dysuria.  She denies any flank pain.  She states that symptoms have been ongoing since Tuesday.  Tmax at home was 101.  She endorses right upper quadrant pain radiating to her back.  She denies any diarrhea, vomiting, constipation.  On  arrival, the patient was febrile to 100.7 tachycardic pulse of 115, otherwise hemodynamically stable, saturating well on room air.  Sinus tachycardia noted on cardiac telemetry.  Due to concern for right upper quadrant abdominal source, code sepsis was initiated.  IV access was obtained and the patient was administered 30 cc/kg IV fluid bolus and broad-spectrum antibiotics.  Right upper quadrant ultrasound was performed and revealed no evidence of cholecystitis, did reveal indeterminate findings concerning for possible ascending cholangitis.   Any laboratory cup was initiated to include a lactic acid that was normal, lipase normal, CBC without leukocytosis or anemia, CMP generally unremarkable with no biliary dysfunction, COVID-19 and influenza PCR testing negative.  Blood cultures were collected and pending.  Urinalysis was ordered.  Due to concern for possible ascending cholangitis although lower suspicion given the patient's reassuring laboratory work-up, GI was consulted for further recommendations.  CT abdomen pelvis was ordered and resulted negative for acute abnormalities.  Small amount  of free fluid was noted in the pelvis.  The appendix was not definitively visualized with no inflammatory changes noted in the right lower quadrant.  No focal liver abnormality was seen with no gallstones, gallbladder wall thickening or biliary dilatation was seen.  No pancreatic inflammatory changes or ductal dilatation noted.  Discussed the case with Dr. Elnoria HowardHung of gastroenterology who recommended admission for observation given the patient's abdominal discomfort and difficulty tolerating oral intake.  We will plan to admit the patient for IV pain control and continued IV hydration and continued evaluation and management.  Final Clinical Impression(s) / ED Diagnoses Final diagnoses:  RUQ pain  Sepsis [facility](HCC)    Rx / DC Orders ED Discharge Orders     None         Ernie AvenaLawsing, Lynk Marti, MD 11/10/21 2252     Ernie AvenaLawsing, Mansoor Hillyard, MD 11/10/21 2254

## 2021-11-10 NOTE — ED Notes (Signed)
To ct

## 2021-11-10 NOTE — TOC Initial Note (Signed)
Transition of Care (TOC) - Initial/Assessment Note  ? ? ?Patient Details  ?Name: Judith Nguyen ?MRN: [id number] ?Date of Birth: 02/09/1996 ? ?Transition of Care (TOC) CM/SW Contact:    ?Verdell Carmine, RN ?Phone Number: ?11/10/2021, 5:14 PM ? ?Clinical Narrative:                 ? ?Transition of Care Department [facility]) has reviewed patient and no TOC needs have been identified at this time. We will continue to monitor patient advancement through interdisciplinary progression rounds. If new patient transition needs arise, please place a TOC consult. ?  ?  ?  ?  ? ? ?Patient Goals and CMS Choice ?  ?  ?  ? ?Expected Discharge Plan and Services ?  ?  ?  ?  ?  ?                ?  ?  ?  ?  ?  ?  ?  ?  ?  ?  ? ?Prior Living Arrangements/Services ?  ?  ?  ?       ?  ?  ?  ?  ? ?Activities of Daily Living ?  ?  ? ?Permission Sought/Granted ?  ?  ?   ?   ?   ?   ? ?Emotional Assessment ?  ?  ?  ?  ?  ?  ? ?Admission diagnosis:  stomach pain, ultrasound ?Patient Active Problem List  ? Diagnosis Date Noted  ? Left ankle pain 10/21/2018  ? ?PCP:  Osie Cheeks, PA-C ?Pharmacy:   ?[id number] [address] ?[address] ?[place] [address] ?Phone: [phone] Fax: [phone] ? ? ? ? ?Social Determinants of Health (SDOH) Interventions ?  ? ?Readmission Risk Interventions ?No flowsheet data found. ? ? ?

## 2021-11-10 NOTE — ED Notes (Signed)
Pt c/o rt lower  quadrant pain ?

## 2021-11-10 NOTE — H&P (Addendum)
[place]   PATIENT NAME: Judith Nguyen    MR#:  [id number]  DATE OF BIRTH:  19-Jan-1996  DATE OF ADMISSION:  11/10/2021  PRIMARY CARE PHYSICIAN: Isabella Bowens, Judith Nguyen   Patient is coming from: Home  REQUESTING/REFERRING PHYSICIAN: Ernie Avena, MD  CHIEF COMPLAINT:   Chief Complaint  Patient presents with   Abdominal Pain    HISTORY OF PRESENT ILLNESS:  Judith Nguyen is a 26 y.o. female with medical history significant for gastritis 4 to 5 years ago, who presented to the ER with acute onset of intermittent fever over the last 6 days with Tmax yesterday of 102.9.  She has been taking Tylenol for.  She admits to right upper quadrant pain since Wednesday with associated nausea without vomiting.  She has not been having any significant p.o. intake secondarily.  She admits to headache and has been having body aches and fatigue.  She denies any diarrhea or melena or bright red bleeding per rectum.  She did not have any jaundice.  No chest pain or dyspnea or cough or wheezing.  No dysuria, oliguria or hematuria, urgency or frequency or flank pain.  No recent sick exposures or travels.  No skin rashes.  ED Course: When she came to the ER, temperature was 101.9/38.8 with a heart rate of 123 and after a gram of Tylenol attempted came down to 9937.2.  Vital signs were otherwise within normal.  Labs revealed mild hyponatremia 132 with otherwise unremarkable CMP.  Lactic acid was 1 and CBC was within normal.  Urine pregnancy test was negative.  Rapid strep a screen came back negative.  Influenza antigens and COVID-19 PCR came back negative.  Blood cultures were drawn.  UA was negative except for 80 ketones in urine culture was sent.  Lipase was normal at 27.  Coagulation profile was normal.  EKG as reviewed by me : EKG showed normal sinus rhythm with a rate of 95 with no acute abnormalities. Imaging: Portable chest ray showed no acute cardiopulmonary disease.  Right upper quadrant  ultrasound revealed no cholelithiasis.  It showed portal triad walls appear echogenic, findings that are indeterminate but can be seen in the setting of cholangitis.  Pneumobilia was less likely given that there is no rating down artifact and recommendation was for CT or MRI.  Abdominal and pelvic CT scan showed small amount of free fluid in the pelvis and the appendix was not definitely visualized.  There was no inflammatory change in the right lower quadrant. PAST MEDICAL HISTORY:  Gastritis  PAST SURGICAL HISTORY:   Past Surgical History:  Procedure Laterality Date   WISDOM TOOTH EXTRACTION      SOCIAL HISTORY:   Social History   Tobacco Use   Smoking status: Never   Smokeless tobacco: Never  Substance Use Topics   Alcohol use: No    FAMILY HISTORY:   Family History  Problem Relation Age of Onset   Healthy Mother    Healthy Father     DRUG ALLERGIES:  No Known Allergies  REVIEW OF SYSTEMS:   ROS As per history of present illness. All pertinent systems were reviewed above. Constitutional, HEENT, cardiovascular, respiratory, GI, GU, musculoskeletal, neuro, psychiatric, endocrine, integumentary and hematologic systems were reviewed and are otherwise negative/unremarkable except for positive findings mentioned above in the HPI.   MEDICATIONS AT HOME:   Prior to Admission medications   Medication Sig Start Date End Date Taking? Authorizing Provider  norethindrone-ethinyl estradiol-FE (LOESTRIN FE)  1-20 MG-MCG tablet Take 1 tablet by mouth daily. 03/25/21  Yes [provider]      VITAL SIGNS:  Blood pressure 130/82, pulse (!) 105, temperature 99 F (37.2 C), resp. rate 20, last menstrual period 10/13/2021, SpO2 100 %.  PHYSICAL EXAMINATION:  Physical Exam  GENERAL:  26 y.o.-year-old Caucasian female patient lying in the bed with no acute distress.  EYES: Pupils equal, round, reactive to light and accommodation. No scleral icterus. Extraocular muscles  intact.  HEENT: Head atraumatic, normocephalic. Oropharynx and nasopharynx clear.  NECK:  Supple, no jugular venous distention. No thyroid enlargement, no tenderness.  LUNGS: Normal breath sounds bilaterally, no wheezing, rales,rhonchi or crepitation. No use of accessory muscles of respiration.  CARDIOVASCULAR: Regular rate and rhythm, S1, S2 normal. No murmurs, rubs, or gallops.  ABDOMEN: Soft, nondistended, with right upper quadrant abdominal tenderness with weakly positive Murphy sign as well as less right lower quadrant abdominal tenderness without rebound tenderness guarding or rigidity.  Bowel sounds present. No organomegaly or mass.  EXTREMITIES: No pedal edema, cyanosis, or clubbing.  NEUROLOGIC: Cranial nerves II through XII are intact. Muscle strength 5/5 in all extremities. Sensation intact. Gait not checked.  PSYCHIATRIC: The patient is alert and oriented x 3.  Normal affect and good eye contact. SKIN: No obvious rash, lesion, or ulcer.   LABORATORY PANEL:   CBC Recent Labs  Lab 11/10/21 1437  WBC 7.4  HGB 14.0  HCT 40.2  PLT 191   ------------------------------------------------------------------------------------------------------------------  Chemistries  Recent Labs  Lab 11/10/21 1437  NA 132*  K 4.1  CL 99  CO2 23  GLUCOSE 103*  BUN 7  CREATININE 0.82  CALCIUM 9.5  AST 16  ALT 11  ALKPHOS 38  BILITOT 0.3   ------------------------------------------------------------------------------------------------------------------  Cardiac Enzymes No results for input(s): TROPONINI in the last 168 hours. ------------------------------------------------------------------------------------------------------------------  RADIOLOGY:  CT ABDOMEN PELVIS W CONTRAST  Result Date: 11/10/2021 CLINICAL DATA:  Right lower quadrant pain. EXAM: CT ABDOMEN AND PELVIS WITH CONTRAST TECHNIQUE: Multidetector CT imaging of the abdomen and pelvis was performed using the standard  protocol following bolus administration of intravenous contrast. RADIATION DOSE REDUCTION: This exam was performed according to the departmental dose-optimization program which includes automated exposure control, adjustment of the mA and/or kV according to patient size and/or use of iterative reconstruction technique. CONTRAST:  OMNIPAQUE IOHEXOL 300 MG/ML  SOLN COMPARISON:  Ultrasound abdomen 01/08/2022. FINDINGS: Lower chest: No acute abnormality. Hepatobiliary: No focal liver abnormality is seen. No gallstones, gallbladder wall thickening, or biliary dilatation. Pancreas: Unremarkable. No pancreatic ductal dilatation or surrounding inflammatory changes. Spleen: Normal in size without focal abnormality. Adrenals/Urinary Tract: Adrenal glands are unremarkable. Kidneys are normal, without renal calculi, focal lesion, or hydronephrosis. Bladder is unremarkable. Stomach/Bowel: Stomach is within normal limits. Appendix not definitely visualized. There are no inflammatory changes surrounding the cecum. No evidence of bowel wall thickening, distention, or inflammatory changes. Vascular/Lymphatic: No significant vascular findings are present. No enlarged abdominal or pelvic lymph nodes. Reproductive: Uterus and bilateral adnexa are unremarkable. Other: There is a small amount of free fluid in the pelvis. There is no focal abdominal wall hernia or free air. Musculoskeletal: No acute or significant osseous findings. IMPRESSION: 1. Small amount of free fluid in the pelvis. 2. Appendix not definitely visualized. No inflammatory change in the right lower quadrant. Electronically Signed   By: Darliss Cheney M.D.   On: 11/10/2021 21:03   DG Chest Port 1 View  Result Date: 11/10/2021 CLINICAL DATA:  Questionable  sepsis - evaluate for abnormality EXAM: PORTABLE CHEST 1 VIEW COMPARISON:  None. FINDINGS: The cardiomediastinal silhouette is normal in contour. No pleural effusion. No pneumothorax. No acute pleuroparenchymal  abnormality. Visualized abdomen is unremarkable. No acute osseous abnormality noted. IMPRESSION: No acute cardiopulmonary abnormality. Electronically Signed   By: Meda Klinefelter M.D.   On: 11/10/2021 16:00   US Abdomen Limited RUQ (LIVER/GB)  Result Date: 11/10/2021 CLINICAL DATA:  Right upper quadrant pain, sepsis. EXAM: ULTRASOUND ABDOMEN LIMITED RIGHT UPPER QUADRANT COMPARISON:  Right upper quadrant ultrasound 10/13/2017. FINDINGS: Gallbladder: No gallstones or wall thickening visualized. No sonographic Murphy sign noted by sonographer. Common bile duct: Diameter: 4.0 mm. Liver: No focal lesion identified. Within normal limits in parenchymal echogenicity. Portal triad walls appear slightly echogenic. No ring down artifact. Portal vein is patent on color Doppler imaging with normal direction of blood flow towards the liver. Other: None. IMPRESSION: 1. No cholelithiasis. 2. Portal triad walls appear echogenic. Findings are indeterminate, but can be seen in the setting of cholangitis. Pneumobilia is less likely given that there is no ring down artifact. Consider further evaluation with CT or MRI. Electronically Signed   By: Darliss Cheney M.D.   On: 11/10/2021 16:41      IMPRESSION AND PLAN:  Assessment and Plan: * SIRS (systemic inflammatory response syndrome) (HCC) - This is manifested by fever, tachycardia and tachypnea. - The patient will be admitted to a medical telemetry bed. - We will need to rule out bacteremia of occult source and sepsis though this is unlikely in the setting of normal WBCs and normal lactic acid. - We will continue empiric antibiotic therapy with IV Rocephin and Flagyl for the possibility of intra-abdominal infection. - We will follow blood cultures. - We will add procalcitonin level. - She will be hydrated with IV normal saline. - Differential diagnosis would include viral gastritis/syndrome.  Right upper quadrant abdominal pain - Pain management to be provided. -  We are continuing IV antibiotic therapy for her GI recommendation. - The patient had a GI consultation with Dr. Elnoria Howard who saw her in the ER. - She had no evidence for cholelithiasis or obstructive jaundice. - Should she continue to have pain specially in the right lower quadrant, general surgery consult can be called event in AM.  Hyponatremia - This is clearly hypovolemic due to volume depletion and dehydration from nausea and anorexia. - She will be hydrated with IV normal saline and will follow up her sodium level.       DVT prophylaxis: Lovenox.  Advanced Care Planning:  Code Status: full code.  Family Communication:  The plan of care was discussed in details with the patient (and family). I answered all questions. The patient agreed to proceed with the above mentioned plan. Further management will depend upon hospital course. Disposition Plan: Back to previous home environment Consults called: Gastroenterology.   All the records are reviewed and case discussed with ED provider.  Status is: Inpatient  At the time of the admission, it appears that the appropriate admission status for this patient is inpatient.  This is judged to be reasonable and necessary in order to provide the required intensity of service to ensure the patient's safety given the presenting symptoms, physical exam findings and initial radiographic and laboratory data in the context of comorbid conditions.  The patient requires inpatient status due to high intensity of service, high risk of further deterioration and high frequency of surveillance required.  I certify that at the time of  admission, it is my clinical judgment that the patient will require inpatient hospital care extending more than 2 midnights.                            Dispo: The patient is from: Home              Anticipated d/c is to: Home              Patient currently is not medically stable to d/c.              Difficult to place patient:  No  Hannah BeatJan A Baldemar Dady M.D on 11/11/2021 at 1:26 AM  [facility]   From 7 PM-7 AM, contact night-coverage [email or web address]  CC: Primary care physician; Isabella BowensWalke, Judith Leeann, Judith Nguyen

## 2021-11-10 NOTE — ED Provider Notes (Signed)
?[facility] ? ? ? ?CSN: [id number] ?Arrival date & time: 11/10/21  1257 ? ? ?  ? ?History   ?Chief Complaint ?Chief Complaint  ?Patient presents with  ? Generalized Body Aches  ? ? ?HPI ?Judith Nguyen is a 26 y.o. female.  ? ?Patient here today for evaluation of body aches, congestion, ear pain and abdominal pain that has been ongoing for the last 5 days.  She reports that abdominal pain seems to have worsened since onset.  She has had intermittent fever the last 4 days.  She denies any vomiting or diarrhea but has had nausea.  She has had decreased appetite as well.  She has been taking Advil and Tylenol without significant relief. ? ?The history is provided by the patient.  ? ?History reviewed. No pertinent past medical history. ? ?Patient Active Problem List  ? Diagnosis Date Noted  ? Left ankle pain 10/21/2018  ? ? ?Past Surgical History:  ?Procedure Laterality Date  ? WISDOM TOOTH EXTRACTION    ? ? ?OB History   ?No obstetric history on file. ?  ? ? ? ?Home Medications   ? ?Prior to Admission medications   ?Medication Sig Start Date End Date Taking? Authorizing Provider  ?ALPRAZolam (XANAX) 0.25 MG tablet Take by mouth. 06/24/21  Yes [provider]  ?norethindrone-ethinyl estradiol-FE (LOESTRIN FE) 1-20 MG-MCG tablet Take 1 tablet by mouth daily. 03/25/21   [provider]  ? ? ?Family History ?Family History  ?Problem Relation Age of Onset  ? Healthy Mother   ? Healthy Father   ? ? ?Social History ?Social History  ? ?Tobacco Use  ? Smoking status: Never  ? Smokeless tobacco: Never  ?Vaping Use  ? Vaping Use: Never used  ?Substance Use Topics  ? Alcohol use: No  ? Drug use: No  ? ? ? ?Allergies   ?Patient has no known allergies. ? ? ?Review of Systems ?Review of Systems  ?Constitutional:  Positive for fever.  ?HENT:  Positive for congestion, ear pain and sore throat.   ?Eyes:  Negative for discharge and redness.  ?Respiratory:  Negative for cough, shortness of breath and wheezing.    ?Gastrointestinal:  Positive for abdominal pain and nausea. Negative for diarrhea and vomiting.  ?Musculoskeletal:  Positive for myalgias.  ? ? ?Physical Exam ?Triage Vital Signs ?ED Triage Vitals  ?Enc Vitals Group  ?   BP   ?   Pulse   ?   Resp   ?   Temp   ?   Temp src   ?   SpO2   ?   Weight   ?   Height   ?   Head Circumference   ?   Peak Flow   ?   Pain Score   ?   Pain Loc   ?   Pain Edu?   ?   Excl. in GC?   ? ?No data found. ? ?Updated Vital Signs ?BP 114/69 (BP Location: Right Arm)   Pulse (!) 123   Temp (!) 101.9 ?F (38.8 ?C) (Oral)   Resp 16   SpO2 98%  ?   ? ?Physical Exam ?Vitals and nursing note reviewed.  ?Constitutional:   ?   General: She is not in acute distress. ?   Appearance: Normal appearance. She is not ill-appearing.  ?HENT:  ?   Head: Normocephalic and atraumatic.  ?   Nose: Congestion present.  ?   Mouth/Throat:  ?   Mouth:  Mucous membranes are moist.  ?   Pharynx: No oropharyngeal exudate or posterior oropharyngeal erythema.  ?Eyes:  ?   Conjunctiva/sclera: Conjunctivae normal.  ?Cardiovascular:  ?   Rate and Rhythm: Regular rhythm. Tachycardia present.  ?   Heart sounds: Normal heart sounds. No murmur heard. ?Pulmonary:  ?   Effort: Pulmonary effort is normal. No respiratory distress.  ?   Breath sounds: Normal breath sounds.  ?Abdominal:  ?   General: Abdomen is flat. There is no distension.  ?   Tenderness: There is abdominal tenderness (TTP to RUQ).  ?Skin: ?   General: Skin is warm and dry.  ?Neurological:  ?   Mental Status: She is alert.  ?Psychiatric:     ?   Mood and Affect: Mood normal.     ?   Thought Content: Thought content normal.  ? ? ? ?UC Treatments / Results  ?Labs ?(all labs ordered are listed, but only abnormal results are displayed) ?Labs Reviewed  ?CULTURE, GROUP A STREP [facility])  ?POCT RAPID STREP A (OFFICE)  ?POCT INFLUENZA A/B  ? ? ?EKG ? ? ?Radiology ?No results found. ? ?Procedures ?Procedures (including critical care time) ? ?Medications Ordered in  UC ?Medications  ?acetaminophen (TYLENOL) tablet 1,000 mg (1,000 mg Oral Given 11/10/21 1347)  ? ? ?Initial Impression / Assessment and Plan / UC Course  ?I have reviewed the triage vital signs and the nursing notes. ? ?Pertinent labs & imaging results that were available during my care of the patient were reviewed by me and considered in my medical decision making (see chart for details). ? ?  ?Given significant tenderness on exam discussed possibility of gallbladder etiology of symptoms.  Discussed that this does not necessarily explain sore throat and other symptoms however recommended further evaluation in the emergency room given continued fevers and worsening abdominal pain.  Patient expresses understanding and is agreeable with plan. ? ?Final Clinical Impressions(s) / UC Diagnoses  ? ?Final diagnoses:  ?Fever, unspecified  ?RUQ pain  ? ?Discharge Instructions   ?None ?  ? ?ED Prescriptions   ?None ?  ? ?PDMP not reviewed this encounter. ?  ?Tomi Bamberger, PA-C ?11/10/21 1418 ? ?

## 2021-11-10 NOTE — Sepsis Progress Note (Signed)
[facility] is following this code sepsis ?

## 2021-11-10 NOTE — ED Triage Notes (Addendum)
Pt sent from [facility].  Reports RUQ pain and nausea since Tuesday.  Denies vomiting, diarrhea, and urinary complaints.  Pt has fever.  States she was given Tylenol at [facility]. ?

## 2021-11-10 NOTE — ED Notes (Signed)
The pt is c/o abd pain   for one week  temp since Wednesday  stuffy nose  the pt was sent here from [facility] neg tests for covid  flu  continuous body aches also ?

## 2021-11-11 DIAGNOSIS — E871 Hypo-osmolality and hyponatremia: Secondary | ICD-10-CM

## 2021-11-11 DIAGNOSIS — D649 Anemia, unspecified: Secondary | ICD-10-CM | POA: Diagnosis not present

## 2021-11-11 DIAGNOSIS — R6510 Systemic inflammatory response syndrome (SIRS) of non-infectious origin without acute organ dysfunction: Secondary | ICD-10-CM | POA: Diagnosis not present

## 2021-11-11 DIAGNOSIS — R1011 Right upper quadrant pain: Secondary | ICD-10-CM | POA: Diagnosis not present

## 2021-11-11 LAB — PROTIME-INR
INR: 1.1 (ref 0.8–1.2)
Prothrombin Time: 13.7 seconds (ref 11.4–15.2)

## 2021-11-11 LAB — BASIC METABOLIC PANEL
Anion gap: 6 (ref 5–15)
BUN: <5 mg/dL — ABNORMAL LOW (ref 6–20)
CO2: 23 mmol/L (ref 22–32)
Calcium: 8.5 mg/dL — ABNORMAL LOW (ref 8.9–10.3)
Chloride: 103 mmol/L (ref 98–111)
Creatinine, Ser: 0.66 mg/dL (ref 0.44–1.00)
GFR, Estimated: >60 mL/min (ref >60)
Glucose, Bld: 140 mg/dL — ABNORMAL HIGH (ref 70–99)
Potassium: 3.8 mmol/L (ref 3.5–5.1)
Sodium: 132 mmol/L — ABNORMAL LOW (ref 135–145)

## 2021-11-11 LAB — CBC
HCT: 32.4 % — ABNORMAL LOW (ref 36.0–46.0)
Hemoglobin: 11.7 g/dL — ABNORMAL LOW (ref 12.0–15.0)
MCH: 30.5 pg (ref 26.0–34.0)
MCHC: 36.1 g/dL — ABNORMAL HIGH (ref 30.0–36.0)
MCV: 84.4 fL (ref 80.0–100.0)
Platelets: 156 10*3/uL (ref 150–400)
RBC: 3.84 MIL/uL — ABNORMAL LOW (ref 3.87–5.11)
RDW: 11.4 % — ABNORMAL LOW (ref 11.5–15.5)
WBC: 6.2 10*3/uL (ref 4.0–10.5)
nRBC: 0 % (ref 0.0–0.2)

## 2021-11-11 LAB — HEPATIC FUNCTION PANEL
ALT: 8 U/L (ref 0–44)
AST: 12 U/L — ABNORMAL LOW (ref 15–41)
Albumin: 3 g/dL — ABNORMAL LOW (ref 3.5–5.0)
Alkaline Phosphatase: 33 U/L — ABNORMAL LOW (ref 38–126)
Bilirubin, Direct: <0.1 mg/dL (ref 0.0–0.2)
Total Bilirubin: 0.4 mg/dL (ref 0.3–1.2)
Total Protein: 6.3 g/dL — ABNORMAL LOW (ref 6.5–8.1)

## 2021-11-11 LAB — CORTISOL-AM, BLOOD: Cortisol - AM: 22.8 ug/dL — ABNORMAL HIGH (ref 6.7–22.6)

## 2021-11-11 LAB — HIV ANTIBODY (ROUTINE TESTING W REFLEX): HIV Screen 4th Generation wRfx: NONREACTIVE

## 2021-11-11 LAB — PROCALCITONIN: Procalcitonin: <0.1 ng/mL

## 2021-11-11 MED ORDER — SODIUM CHLORIDE 0.9 % IV SOLN
1.0000 g | INTRAVENOUS | Status: DC
  Administered 2021-11-11 – 2021-11-12 (×2): 1 g via INTRAVENOUS
  Filled 2021-11-11 (×3): qty 10

## 2021-11-11 MED ORDER — MORPHINE SULFATE (PF) 2 MG/ML IV SOLN
2.0000 mg | INTRAVENOUS | Status: DC | PRN
  Administered 2021-11-11 (×2): 2 mg via INTRAVENOUS
  Filled 2021-11-11 (×3): qty 1

## 2021-11-11 NOTE — Assessment & Plan Note (Addendum)
Etiology unclear.  Unremarkable.  Right upper quadrant ultrasound as well as CT of the abdomen pelvis did not show any abnormalities in the right upper quadrant.  No evidence for cholecystitis.  CT scan did not show any inflammatory changes in the right lower quadrant.  Appendix was not visualized. ?Seen by gastroenterology.  HIDA scan has been ordered. ?

## 2021-11-11 NOTE — Hospital Course (Signed)
26 y.o. female with medical history significant for gastritis 4 to 5 years ago, who presented to the ER with acute onset of intermittent fever over the last 6 days with Tmax of 102.9.  She has also been experiencing pain in the right upper quadrant.  She underwent evaluation in the emergency department including right upper quadrant ultrasound, CT of the abdomen pelvis.  No clear abnormalities detected.  Patient was seen by gastroenterology and hospitalized for further management.   ?

## 2021-11-11 NOTE — Assessment & Plan Note (Addendum)
Likely due to hypovolemia.  IV fluids were initiated.  Sodium level has improved.  Continue IV fluids for another 24 hours. ?

## 2021-11-11 NOTE — Assessment & Plan Note (Signed)
Drop in hemoglobin is likely dilutional.  No evidence of overt bleeding. ?

## 2021-11-11 NOTE — Progress Notes (Signed)
[facility] PROGRESS NOTE   Judith Nguyen ZOX:[id number] DOB: 1995-12-11 DOA: 11/10/2021  1 DOS: the patient was seen and examined on 11/11/2021  PCP: Isabella BowensWalke, Devyn Leeann, PA-C  Brief History and Hospital Course:  26 y.o. female with medical history significant for gastritis 4 to 5 years ago, who presented to the ER with acute onset of intermittent fever over the last 6 days with Tmax of 102.9.  She has also been experiencing pain in the right upper quadrant.  She underwent evaluation in the emergency department including right upper quadrant ultrasound, CT of the abdomen pelvis.  No clear abnormalities detected.  Patient was seen by gastroenterology and hospitalized for further management.    Consultants: Gastroenterology  Procedures: None yet    Subjective: Patient continues to have pain in the right upper abdomen.  Some nausea but no vomiting this morning.  Complains of feeling fatigued.  No diarrhea.    Assessment/Plan:   * SIRS (systemic inflammatory response syndrome) (HCC) This is manifested by fever, tachycardia and tachypnea. No clear source of infection identified.  Blood cultures are pending.  WBC is noted to be normal today.  Procalcitonin less than 0.1.  Lactic acid level was normal.  UA unremarkable.  Chest x-ray did not show any acute findings. She is empirically on vancomycin, ceftriaxone and metronidazole. Hemodynamics are stable.  She is mildly tachycardic.  Right upper quadrant abdominal pain Etiology unclear.  Unremarkable.  Right upper quadrant ultrasound as well as CT of the abdomen pelvis did not show any abnormalities in the right upper quadrant.  No evidence for cholecystitis.  CT scan did not show any inflammatory changes in the right lower quadrant.  Appendix was not visualized. Await further GI input.    Hyponatremia Likely due to hypovolemia.  Being given IV fluids.  Continue to monitor closely.  Normocytic anemia Drop in hemoglobin is  likely dilutional.  No evidence of overt bleeding.     DVT Prophylaxis: Lovenox Code Status: Full code Family Communication: Discussed with patient and her husband Disposition Plan: Return home when improved  Status is: Inpatient Remains inpatient appropriate because: Persistent abdominal pain, fever     Medications: Scheduled:  enoxaparin (LOVENOX) injection  40 mg Subcutaneous Q24H   norethindrone-ethinyl estradiol-FE  1 tablet Oral Daily   Continuous:  sodium chloride 125 mL/hr at 11/11/21 1030   cefTRIAXone (ROCEPHIN)  IV     lactated ringers Stopped (11/10/21 2203)   metronidazole 500 mg (11/11/21 1032)   vancomycin 1,000 mg (11/11/21 0610)   WJX:BJYNWGNFAOZHYPRN:acetaminophen **OR** acetaminophen, magnesium hydroxide, morphine injection, ondansetron **OR** ondansetron (ZOFRAN) IV, traZODone  Antibiotics: Anti-infectives (From admission, onward)    Start     Dose/Rate Route Frequency Ordered Stop   11/11/21 1700  cefTRIAXone (ROCEPHIN) 1 g in sodium chloride 0.9 % 100 mL IVPB        1 g 200 mL/hr over 30 Minutes Intravenous Every 24 hours 11/11/21 0012     11/11/21 0700  vancomycin (VANCOCIN) IVPB 1000 mg/200 mL premix        1,000 mg 200 mL/hr over 60 Minutes Intravenous Every 12 hours 11/10/21 1659     11/11/21 0200  metroNIDAZOLE (FLAGYL) IVPB 500 mg        500 mg 100 mL/hr over 60 Minutes Intravenous Every 8 hours 11/10/21 2337     11/10/21 2345  cefTRIAXone (ROCEPHIN) 1 g in sodium chloride 0.9 % 100 mL IVPB  Status:  Discontinued  1 g 200 mL/hr over 30 Minutes Intravenous Every 24 hours 11/10/21 2337 11/11/21 0011   11/10/21 1615  vancomycin (VANCOREADY) IVPB 1500 mg/300 mL        1,500 mg 150 mL/hr over 120 Minutes Intravenous  Once 11/10/21 1607 11/10/21 2203   11/10/21 1545  cefTRIAXone (ROCEPHIN) 2 g in sodium chloride 0.9 % 100 mL IVPB        2 g 200 mL/hr over 30 Minutes Intravenous  Once 11/10/21 1538 11/10/21 1752   11/10/21 1545  metroNIDAZOLE (FLAGYL) IVPB  500 mg        500 mg 100 mL/hr over 60 Minutes Intravenous  Once 11/10/21 1538 11/10/21 1851   11/10/21 1545  vancomycin (VANCOCIN) IVPB 1000 mg/200 mL premix  Status:  Discontinued        1,000 mg 200 mL/hr over 60 Minutes Intravenous  Once 11/10/21 1538 11/10/21 1607       Objective:  Vital Signs  Vitals:   11/11/21 0138 11/11/21 0500 11/11/21 0650 11/11/21 0727  BP: 114/89 107/61  113/72  Pulse: (!) 109 (!) 110  (!) 107  Resp: 18 19  16   Temp: 98.8 F (37.1 C) 100.3 F (37.9 C)  98.6 F (37 C)  TempSrc: Oral Oral  Oral  SpO2: 100% 99%  97%  Weight:   67.1 kg   Height:   5\' 9"  (1.753 m)     Intake/Output Summary (Last 24 hours) at 11/11/2021 1053 Last data filed at 11/11/2021 5573 Gross per 24 hour  Intake 1434.17 ml  Output --  Net 1434.17 ml   Filed Weights   11/11/21 0650  Weight: 67.1 kg    General appearance: Awake alert.  In no distress Resp: Clear to auscultation bilaterally.  Normal effort Cardio: S1-S2 is normal regular.  No S3-S4.  No rubs murmurs or bruit GI: Abdomen is soft.  Tender in the right upper quadrant without any rebound rigidity or guarding.  No masses organomegaly.  Bowel sounds present. Extremities: No edema.  Moving all of her extremities. Neurologic: Alert and oriented x3.  No focal neurological deficits.    Lab Results:  Data Reviewed: I have personally reviewed labs and imaging study reports  CBC: Recent Labs  Lab 11/10/21 1437 11/11/21 0643  WBC 7.4 6.2  HGB 14.0 11.7*  HCT 40.2 32.4*  MCV 85.5 84.4  PLT 191 156    Basic Metabolic Panel: Recent Labs  Lab 11/10/21 1437 11/11/21 0643  NA 132* 132*  K 4.1 3.8  CL 99 103  CO2 23 23  GLUCOSE 103* 140*  BUN 7 <5*  CREATININE 0.82 0.66  CALCIUM 9.5 8.5*    GFR: Estimated Creatinine Clearance: 111.4 mL/min (by C-G formula based on SCr of 0.66 mg/dL).  Liver Function Tests: Recent Labs  Lab 11/10/21 1437  AST 16  ALT 11  ALKPHOS 38  BILITOT 0.3  PROT 7.8   ALBUMIN 3.9    Recent Labs  Lab 11/10/21 1437  LIPASE 27     Coagulation Profile: Recent Labs  Lab 11/10/21 1613 11/11/21 0643  INR 1.0 1.1     Recent Results (from the past 240 hour(s))  Blood Culture (routine x 2)     Status: None (Preliminary result)   Collection Time: 11/10/21  4:18 PM   Specimen: BLOOD  Result Value Ref Range Status   Specimen Description BLOOD SITE NOT SPECIFIED  Final   Special Requests   Final    BOTTLES DRAWN AEROBIC AND ANAEROBIC Blood  Culture adequate volume   Culture   Final    NO GROWTH < 24 HOURS Performed at [facility], [address], [place], [place] [address]    Report Status PENDING  Incomplete  Blood Culture (routine x 2)     Status: None (Preliminary result)   Collection Time: 11/10/21  4:27 PM   Specimen: BLOOD  Result Value Ref Range Status   Specimen Description BLOOD SITE NOT SPECIFIED  Final   Special Requests   Final    BOTTLES DRAWN AEROBIC AND ANAEROBIC Blood Culture results may not be optimal due to an inadequate volume of blood received in culture bottles   Culture   Final    NO GROWTH < 24 HOURS Performed at [facility], [address] [place], [place] [address]    Report Status PENDING  Incomplete  Resp Panel by RT-PCR (Flu A&B, Covid) Nasopharyngeal Swab     Status: None   Collection Time: 11/10/21  7:10 PM   Specimen: Nasopharyngeal Swab; Nasopharyngeal(NP) swabs in vial transport medium  Result Value Ref Range Status   SARS Coronavirus 2 by RT PCR NEGATIVE NEGATIVE Final    Comment: (NOTE) SARS-CoV-2 target nucleic acids are NOT DETECTED.  The SARS-CoV-2 RNA is generally detectable in upper respiratory specimens during the acute phase of infection. The lowest concentration of SARS-CoV-2 viral copies this assay can detect is 138 copies/mL. A negative result does not preclude SARS-Cov-2 infection and should not be used as the sole basis for treatment or other patient management decisions. A  negative result may occur with  improper specimen collection/handling, submission of specimen other than nasopharyngeal swab, presence of viral mutation(s) within the areas targeted by this assay, and inadequate number of viral copies(<138 copies/mL). A negative result must be combined with clinical observations, patient history, and epidemiological information. The expected result is Negative.  Fact Sheet for Patients:  BloggerCourse.com  Fact Sheet for Healthcare Providers:  SeriousBroker.it  This test is no t yet approved or cleared by the [place] FDA and  has been authorized for detection and/or diagnosis of SARS-CoV-2 by FDA under an Emergency Use Authorization (EUA). This EUA will remain  in effect (meaning this test can be used) for the duration of the COVID-19 declaration under Section 564(b)(1) of the Act, 21 U.S.C.section 360bbb-3(b)(1), unless the authorization is terminated  or revoked sooner.       Influenza A by PCR NEGATIVE NEGATIVE Final   Influenza B by PCR NEGATIVE NEGATIVE Final    Comment: (NOTE) The Xpert Xpress SARS-CoV-2/FLU/RSV plus assay is intended as an aid in the diagnosis of influenza from Nasopharyngeal swab specimens and should not be used as a sole basis for treatment. Nasal washings and aspirates are unacceptable for Xpert Xpress SARS-CoV-2/FLU/RSV testing.  Fact Sheet for Patients: BloggerCourse.com  Fact Sheet for Healthcare Providers: SeriousBroker.it  This test is not yet approved or cleared by the [place] FDA and has been authorized for detection and/or diagnosis of SARS-CoV-2 by FDA under an Emergency Use Authorization (EUA). This EUA will remain in effect (meaning this test can be used) for the duration of the COVID-19 declaration under Section 564(b)(1) of the Act, 21 U.S.C. section 360bbb-3(b)(1), unless the authorization  is terminated or revoked.  Performed at [facility], [address], [place], [place] [address]       Radiology Studies: CT ABDOMEN PELVIS W CONTRAST  Result Date: 11/10/2021 CLINICAL DATA:  Right lower quadrant pain. EXAM: CT  ABDOMEN AND PELVIS WITH CONTRAST TECHNIQUE: Multidetector CT imaging of the abdomen and pelvis was performed using the standard protocol following bolus administration of intravenous contrast. RADIATION DOSE REDUCTION: This exam was performed according to the departmental dose-optimization program which includes automated exposure control, adjustment of the mA and/or kV according to patient size and/or use of iterative reconstruction technique. CONTRAST:  OMNIPAQUE IOHEXOL 300 MG/ML  SOLN COMPARISON:  Ultrasound abdomen 01/08/2022. FINDINGS: Lower chest: No acute abnormality. Hepatobiliary: No focal liver abnormality is seen. No gallstones, gallbladder wall thickening, or biliary dilatation. Pancreas: Unremarkable. No pancreatic ductal dilatation or surrounding inflammatory changes. Spleen: Normal in size without focal abnormality. Adrenals/Urinary Tract: Adrenal glands are unremarkable. Kidneys are normal, without renal calculi, focal lesion, or hydronephrosis. Bladder is unremarkable. Stomach/Bowel: Stomach is within normal limits. Appendix not definitely visualized. There are no inflammatory changes surrounding the cecum. No evidence of bowel wall thickening, distention, or inflammatory changes. Vascular/Lymphatic: No significant vascular findings are present. No enlarged abdominal or pelvic lymph nodes. Reproductive: Uterus and bilateral adnexa are unremarkable. Other: There is a small amount of free fluid in the pelvis. There is no focal abdominal wall hernia or free air. Musculoskeletal: No acute or significant osseous findings. IMPRESSION: 1. Small amount of free fluid in the pelvis. 2. Appendix not definitely visualized. No inflammatory change in the right lower  quadrant. Electronically Signed   By: Darliss Cheney M.D.   On: 11/10/2021 21:03   DG Chest Port 1 View  Result Date: 11/10/2021 CLINICAL DATA:  Questionable sepsis - evaluate for abnormality EXAM: PORTABLE CHEST 1 VIEW COMPARISON:  None. FINDINGS: The cardiomediastinal silhouette is normal in contour. No pleural effusion. No pneumothorax. No acute pleuroparenchymal abnormality. Visualized abdomen is unremarkable. No acute osseous abnormality noted. IMPRESSION: No acute cardiopulmonary abnormality. Electronically Signed   By: Meda Klinefelter M.D.   On: 11/10/2021 16:00   US Abdomen Limited RUQ (LIVER/GB)  Result Date: 11/10/2021 CLINICAL DATA:  Right upper quadrant pain, sepsis. EXAM: ULTRASOUND ABDOMEN LIMITED RIGHT UPPER QUADRANT COMPARISON:  Right upper quadrant ultrasound 10/13/2017. FINDINGS: Gallbladder: No gallstones or wall thickening visualized. No sonographic Murphy sign noted by sonographer. Common bile duct: Diameter: 4.0 mm. Liver: No focal lesion identified. Within normal limits in parenchymal echogenicity. Portal triad walls appear slightly echogenic. No ring down artifact. Portal vein is patent on color Doppler imaging with normal direction of blood flow towards the liver. Other: None. IMPRESSION: 1. No cholelithiasis. 2. Portal triad walls appear echogenic. Findings are indeterminate, but can be seen in the setting of cholangitis. Pneumobilia is less likely given that there is no ring down artifact. Consider further evaluation with CT or MRI. Electronically Signed   By: Darliss Cheney M.D.   On: 11/10/2021 16:41       LOS: 1 day   [employer]  [facility] Pager on [email or web address]  11/11/2021, 10:53 AM

## 2021-11-11 NOTE — Progress Notes (Signed)
UNASSIGNED PATIENT ?Subjective: ?Patient seems to be doing better today.  His fever seems to have resolved and she wants to advance her diet to a more normal meal as she has been on full liquids for couple of days.  She has not had a BM in for the last 4 days.  She has had a history of right upper quadrant pain worse with greasy foods in the past. Even at the present time she complains of pain in the right upper quadrant. On detailed questioning her symptoms began with a sore throat myalgias and fever and then the abdominal pain came on.  Right upper quadrant ultrasound done on 11/10/2021 showed no evidence of cholelithiasis the portal triad walls appeared echogenic consistent with possible cholangitis but no new pneumobilia was seen and a CT scan of the abdomen was recommended for further evaluation.  The CT scan showed small amount of free fluid in the pelvic pelvis and the appendix was not definitely visualized but no other abnormalities were noted. ? ?Objective: ?Vital signs in last 24 hours: ?Temp:  [98.6 ?F (37 ?C)-100.8 ?F (38.2 ?C)] 98.6 ?F (37 ?C) (03/06 8891) ?Pulse Rate:  [87-110] 107 (03/06 0727) ?Resp:  [14-24] 16 (03/06 0727) ?BP: (107-130)/(61-89) 113/72 (03/06 0727) ?SpO2:  [97 %-100 %] 97 % (03/06 0727) ?Weight:  [67.1 kg] 67.1 kg (03/06 0650) ?Last BM Date : 11/10/21 ? ?Intake/Output from previous day: ?03/05 0701 - 03/06 0700 ?In: 1434.2 [I.V.:1334.2; IV Piggyback:100] ?Out: -  ?Intake/Output this shift: ?Total I/O ?In: 664 [I.V.:664] ?Out: -  ? ?General appearance: alert, cooperative, appears stated age, fatigued, and no distress ?Resp: clear to auscultation bilaterally ?Cardio: regular rate and rhythm, S1, S2 normal, no murmur, click, rub or gallop ?GI: soft with right upper quadrant tenderness on palpation with no guarding rebound or rigidity; ; bowel sounds normal; no masses,  no organomegaly ? ?Lab Results: ?Recent Labs  ?  11/10/21 ?1437 11/11/21 ?6945  ?WBC 7.4 6.2  ?HGB 14.0 11.7*  ?HCT 40.2  32.4*  ?PLT 191 156  ? ?BMET ?Recent Labs  ?  11/10/21 ?1437 11/11/21 ?0388  ?NA 132* 132*  ?K 4.1 3.8  ?CL 99 103  ?CO2 23 23  ?GLUCOSE 103* 140*  ?BUN 7 <5*  ?CREATININE 0.82 0.66  ?CALCIUM 9.5 8.5*  ? ?LFT ?Recent Labs  ?  11/11/21 ?0643  ?PROT 6.3*  ?ALBUMIN 3.0*  ?AST 12*  ?ALT 8  ?ALKPHOS 33*  ?BILITOT 0.4  ?BILIDIR <0.1  ?IBILI NOT CALCULATED  ? ?PT/INR ?Recent Labs  ?  11/10/21 ?1613 11/11/21 ?0643  ?LABPROT 13.2 13.7  ?INR 1.0 1.1  ? ?Studies/Results: ?CT ABDOMEN PELVIS W CONTRAST ? ?Result Date: 11/10/2021 ?CLINICAL DATA:  Right lower quadrant pain. EXAM: CT ABDOMEN AND PELVIS WITH CONTRAST TECHNIQUE: Multidetector CT imaging of the abdomen and pelvis was performed using the standard protocol following bolus administration of intravenous contrast. RADIATION DOSE REDUCTION: This exam was performed according to the departmental dose-optimization program which includes automated exposure control, adjustment of the mA and/or kV according to patient size and/or use of iterative reconstruction technique. CONTRAST:  OMNIPAQUE IOHEXOL 300 MG/ML  SOLN COMPARISON:  Ultrasound abdomen 01/08/2022. FINDINGS: Lower chest: No acute abnormality. Hepatobiliary: No focal liver abnormality is seen. No gallstones, gallbladder wall thickening, or biliary dilatation. Pancreas: Unremarkable. No pancreatic ductal dilatation or surrounding inflammatory changes. Spleen: Normal in size without focal abnormality. Adrenals/Urinary Tract: Adrenal glands are unremarkable. Kidneys are normal, without renal calculi, focal lesion, or hydronephrosis. Bladder is unremarkable.  Stomach/Bowel: Stomach is within normal limits. Appendix not definitely visualized. There are no inflammatory changes surrounding the cecum. No evidence of bowel wall thickening, distention, or inflammatory changes. Vascular/Lymphatic: No significant vascular findings are present. No enlarged abdominal or pelvic lymph nodes. Reproductive: Uterus and bilateral adnexa  are unremarkable. Other: There is a small amount of free fluid in the pelvis. There is no focal abdominal wall hernia or free air. Musculoskeletal: No acute or significant osseous findings. IMPRESSION: 1. Small amount of free fluid in the pelvis. 2. Appendix not definitely visualized. No inflammatory change in the right lower quadrant. Electronically Signed   By: Darliss Cheney M.D.   On: 11/10/2021 21:03  ? ?DG Chest Port 1 View ? ?Result Date: 11/10/2021 ?CLINICAL DATA:  Questionable sepsis - evaluate for abnormality EXAM: PORTABLE CHEST 1 VIEW COMPARISON:  None. FINDINGS: The cardiomediastinal silhouette is normal in contour. No pleural effusion. No pneumothorax. No acute pleuroparenchymal abnormality. Visualized abdomen is unremarkable. No acute osseous abnormality noted. IMPRESSION: No acute cardiopulmonary abnormality. Electronically Signed   By: Meda Klinefelter M.D.   On: 11/10/2021 16:00  ? ?US Abdomen Limited RUQ (LIVER/GB) ? ?Result Date: 11/10/2021 ?CLINICAL DATA:  Right upper quadrant pain, sepsis. EXAM: ULTRASOUND ABDOMEN LIMITED RIGHT UPPER QUADRANT COMPARISON:  Right upper quadrant ultrasound 10/13/2017. FINDINGS: Gallbladder: No gallstones or wall thickening visualized. No sonographic Murphy sign noted by sonographer. Common bile duct: Diameter: 4.0 mm. Liver: No focal lesion identified. Within normal limits in parenchymal echogenicity. Portal triad walls appear slightly echogenic. No ring down artifact. Portal vein is patent on color Doppler imaging with normal direction of blood flow towards the liver. Other: None. IMPRESSION: 1. No cholelithiasis. 2. Portal triad walls appear echogenic. Findings are indeterminate, but can be seen in the setting of cholangitis. Pneumobilia is less likely given that there is no ring down artifact. Consider further evaluation with CT or MRI. Electronically Signed   By: Darliss Cheney M.D.   On: 11/10/2021 16:41   ? ?Medications: I have reviewed the patient's current  medications. ?Prior to Admission:  ?Medications Prior to Admission  ?Medication Sig Dispense Refill Last Dose  ? norethindrone-ethinyl estradiol-FE (LOESTRIN FE) 1-20 MG-MCG tablet Take 1 tablet by mouth daily.   11/09/2021  ? ?Scheduled: ? enoxaparin (LOVENOX) injection  40 mg Subcutaneous Q24H  ? norethindrone-ethinyl estradiol-FE  1 tablet Oral Daily  ? ?Continuous: ? sodium chloride 125 mL/hr at 11/11/21 1030  ? cefTRIAXone (ROCEPHIN)  IV    ? metronidazole 500 mg (11/11/21 1032)  ? vancomycin 1,000 mg (11/11/21 0610)  ? ? ?Assessment/Plan: ?1) Right upper quadrant pain-we will plan to do a HIDA with CCK injection to rule out biliary dyskinesia.  I think the patient's diet can be advanced to her low-fat diet.  Further recommendation made after the HIDA scan results have been procured. ?2) SIRS-suspect her symptoms are from a viral syndrome. ?3) Hyponatremia. ? LOS: 1 day  ? ?Charna Elizabeth ?11/11/2021, 3:25 PM ? ? ?

## 2021-11-11 NOTE — Assessment & Plan Note (Addendum)
This is manifested by fever, tachycardia and tachypnea. ?No clear source of infection identified.  Blood cultures are pending.  WBC noted to be normal.  Procalcitonin less than 0.1.  Lactic acid level was normal.  UA unremarkable.  Chest x-ray did not show any acute findings. ?She is empirically on vancomycin, ceftriaxone and metronidazole. ?Hemodynamics are stable.  Heart rate is improved.  Fever has subsided. ?Blood c/s negative for 2 days. Will stop Vanc. ?

## 2021-11-12 ENCOUNTER — Inpatient Hospital Stay (HOSPITAL_COMMUNITY): Payer: BC Managed Care – PPO

## 2021-11-12 DIAGNOSIS — R1011 Right upper quadrant pain: Secondary | ICD-10-CM | POA: Diagnosis not present

## 2021-11-12 DIAGNOSIS — D649 Anemia, unspecified: Secondary | ICD-10-CM | POA: Diagnosis not present

## 2021-11-12 DIAGNOSIS — R6510 Systemic inflammatory response syndrome (SIRS) of non-infectious origin without acute organ dysfunction: Secondary | ICD-10-CM | POA: Diagnosis not present

## 2021-11-12 LAB — CBC
HCT: 33.8 % — ABNORMAL LOW (ref 36.0–46.0)
Hemoglobin: 11.8 g/dL — ABNORMAL LOW (ref 12.0–15.0)
MCH: 29.5 pg (ref 26.0–34.0)
MCHC: 34.9 g/dL (ref 30.0–36.0)
MCV: 84.5 fL (ref 80.0–100.0)
Platelets: 160 10*3/uL (ref 150–400)
RBC: 4 MIL/uL (ref 3.87–5.11)
RDW: 11.5 % (ref 11.5–15.5)
WBC: 5.1 10*3/uL (ref 4.0–10.5)
nRBC: 0 % (ref 0.0–0.2)

## 2021-11-12 LAB — COMPREHENSIVE METABOLIC PANEL
ALT: 8 U/L (ref 0–44)
AST: 12 U/L — ABNORMAL LOW (ref 15–41)
Albumin: 3.1 g/dL — ABNORMAL LOW (ref 3.5–5.0)
Alkaline Phosphatase: 29 U/L — ABNORMAL LOW (ref 38–126)
Anion gap: 8 (ref 5–15)
BUN: <5 mg/dL — ABNORMAL LOW (ref 6–20)
CO2: 22 mmol/L (ref 22–32)
Calcium: 8.6 mg/dL — ABNORMAL LOW (ref 8.9–10.3)
Chloride: 106 mmol/L (ref 98–111)
Creatinine, Ser: 0.65 mg/dL (ref 0.44–1.00)
GFR, Estimated: >60 mL/min (ref >60)
Glucose, Bld: 92 mg/dL (ref 70–99)
Potassium: 3.5 mmol/L (ref 3.5–5.1)
Sodium: 136 mmol/L (ref 135–145)
Total Bilirubin: 0.2 mg/dL — ABNORMAL LOW (ref 0.3–1.2)
Total Protein: 6.5 g/dL (ref 6.5–8.1)

## 2021-11-12 LAB — CULTURE, GROUP A STREP (THRC)

## 2021-11-12 MED ORDER — PANTOPRAZOLE SODIUM 40 MG PO TBEC
40.0000 mg | DELAYED_RELEASE_TABLET | Freq: Every day | ORAL | Status: DC
  Administered 2021-11-12 – 2021-11-13 (×2): 40 mg via ORAL
  Filled 2021-11-12 (×2): qty 1

## 2021-11-12 MED ORDER — TECHNETIUM TC 99M MEBROFENIN IV KIT
5.1000 | PACK | Freq: Once | INTRAVENOUS | Status: AC | PRN
  Administered 2021-11-12: 5.1 via INTRAVENOUS

## 2021-11-12 MED ORDER — POTASSIUM CHLORIDE CRYS ER 20 MEQ PO TBCR
40.0000 meq | EXTENDED_RELEASE_TABLET | Freq: Once | ORAL | Status: AC
  Administered 2021-11-12: 40 meq via ORAL
  Filled 2021-11-12: qty 2

## 2021-11-12 NOTE — Progress Notes (Signed)
Subjective: ?Feeling better. ? ?Objective: ?Vital signs in last 24 hours: ?Temp:  [97.9 ?F (36.6 ?C)-98.4 ?F (36.9 ?C)] 98 ?F (36.7 ?C) (03/07 8657) ?Pulse Rate:  [74-94] 74 (03/07 0731) ?Resp:  [16-18] 18 (03/07 0731) ?BP: (114-126)/(75-85) 125/83 (03/07 0731) ?SpO2:  [99 %-100 %] 100 % (03/07 0731) ?Last BM Date : 11/10/21 ? ?Intake/Output from previous day: ?03/06 0701 - 03/07 0700 ?In: 2901.8 [I.V.:2201.8; IV Piggyback:700] ?Out: -  ?Intake/Output this shift: ?No intake/output data recorded. ? ?General appearance: alert and no distress ?GI: tender in the RUQ, but improved ? ?Lab Results: ?Recent Labs  ?  11/10/21 ?1437 11/11/21 ?8469 11/12/21 ?0119  ?WBC 7.4 6.2 5.1  ?HGB 14.0 11.7* 11.8*  ?HCT 40.2 32.4* 33.8*  ?PLT 191 156 160  ? ?BMET ?Recent Labs  ?  11/10/21 ?1437 11/11/21 ?6295 11/12/21 ?0119  ?NA 132* 132* 136  ?K 4.1 3.8 3.5  ?CL 99 103 106  ?CO2 23 23 22   ?GLUCOSE 103* 140* 92  ?BUN 7 <5* <5*  ?CREATININE 0.82 0.66 0.65  ?CALCIUM 9.5 8.5* 8.6*  ? ?LFT ?Recent Labs  ?  11/11/21 ?0643 11/12/21 ?0119  ?PROT 6.3* 6.5  ?ALBUMIN 3.0* 3.1*  ?AST 12* 12*  ?ALT 8 8  ?ALKPHOS 33* 29*  ?BILITOT 0.4 0.2*  ?BILIDIR <0.1  --   ?IBILI NOT CALCULATED  --   ? ?PT/INR ?Recent Labs  ?  11/10/21 ?1613 11/11/21 ?0643  ?LABPROT 13.2 13.7  ?INR 1.0 1.1  ? ?Hepatitis Panel ?No results for input(s): HEPBSAG, HCVAB, HEPAIGM, HEPBIGM in the last 72 hours. ?C-Diff ?No results for input(s): CDIFFTOX in the last 72 hours. ?Fecal Lactopherrin ?No results for input(s): FECLLACTOFRN in the last 72 hours. ? ?Studies/Results: ?CT ABDOMEN PELVIS W CONTRAST ? ?Result Date: 11/10/2021 ?CLINICAL DATA:  Right lower quadrant pain. EXAM: CT ABDOMEN AND PELVIS WITH CONTRAST TECHNIQUE: Multidetector CT imaging of the abdomen and pelvis was performed using the standard protocol following bolus administration of intravenous contrast. RADIATION DOSE REDUCTION: This exam was performed according to the departmental dose-optimization program which  includes automated exposure control, adjustment of the mA and/or kV according to patient size and/or use of iterative reconstruction technique. CONTRAST:  01/10/2022 OMNIPAQUE IOHEXOL 300 MG/ML  SOLN COMPARISON:  Ultrasound abdomen 01/08/2022. FINDINGS: Lower chest: No acute abnormality. Hepatobiliary: No focal liver abnormality is seen. No gallstones, gallbladder wall thickening, or biliary dilatation. Pancreas: Unremarkable. No pancreatic ductal dilatation or surrounding inflammatory changes. Spleen: Normal in size without focal abnormality. Adrenals/Urinary Tract: Adrenal glands are unremarkable. Kidneys are normal, without renal calculi, focal lesion, or hydronephrosis. Bladder is unremarkable. Stomach/Bowel: Stomach is within normal limits. Appendix not definitely visualized. There are no inflammatory changes surrounding the cecum. No evidence of bowel wall thickening, distention, or inflammatory changes. Vascular/Lymphatic: No significant vascular findings are present. No enlarged abdominal or pelvic lymph nodes. Reproductive: Uterus and bilateral adnexa are unremarkable. Other: There is a small amount of free fluid in the pelvis. There is no focal abdominal wall hernia or free air. Musculoskeletal: No acute or significant osseous findings. IMPRESSION: 1. Small amount of free fluid in the pelvis. 2. Appendix not definitely visualized. No inflammatory change in the right lower quadrant. Electronically Signed   By: 03/10/2022 M.D.   On: 11/10/2021 21:03  ? ?DG Chest Port 1 View ? ?Result Date: 11/10/2021 ?CLINICAL DATA:  Questionable sepsis - evaluate for abnormality EXAM: PORTABLE CHEST 1 VIEW COMPARISON:  None. FINDINGS: The cardiomediastinal silhouette is normal in contour. No pleural effusion.  No pneumothorax. No acute pleuroparenchymal abnormality. Visualized abdomen is unremarkable. No acute osseous abnormality noted. IMPRESSION: No acute cardiopulmonary abnormality. Electronically Signed   By: Meda Klinefelter M.D.   On: 11/10/2021 16:00  ? ?US Abdomen Limited RUQ (LIVER/GB) ? ?Result Date: 11/10/2021 ?CLINICAL DATA:  Right upper quadrant pain, sepsis. EXAM: ULTRASOUND ABDOMEN LIMITED RIGHT UPPER QUADRANT COMPARISON:  Right upper quadrant ultrasound 10/13/2017. FINDINGS: Gallbladder: No gallstones or wall thickening visualized. No sonographic Murphy sign noted by sonographer. Common bile duct: Diameter: 4.0 mm. Liver: No focal lesion identified. Within normal limits in parenchymal echogenicity. Portal triad walls appear slightly echogenic. No ring down artifact. Portal vein is patent on color Doppler imaging with normal direction of blood flow towards the liver. Other: None. IMPRESSION: 1. No cholelithiasis. 2. Portal triad walls appear echogenic. Findings are indeterminate, but can be seen in the setting of cholangitis. Pneumobilia is less likely given that there is no ring down artifact. Consider further evaluation with CT or MRI. Electronically Signed   By: Darliss Cheney M.D.   On: 11/10/2021 16:41   ? ?Medications: Scheduled: ? enoxaparin (LOVENOX) injection  40 mg Subcutaneous Q24H  ? norethindrone-ethinyl estradiol-FE  1 tablet Oral Daily  ? potassium chloride  40 mEq Oral Once  ? ?Continuous: ? sodium chloride 75 mL/hr at 11/12/21 0657  ? cefTRIAXone (ROCEPHIN)  IV 1 g (11/11/21 1734)  ? metronidazole 500 mg (11/12/21 1018)  ? vancomycin 1,000 mg (11/12/21 0654)  ? ? ?Assessment/Plan: ?1) RUQ pain. ?2) Fever - resolved. ? ? Clinically she is improving.  Her fever resolved and her pain is getting better.  She is still very tender.  This appears to be more of a viral etiology.   ? ?Plan: ?1) Await HIDA.  If normal she can be discharged home. ? LOS: 2 days  ? ?Judith Nguyen D ?11/12/2021, 12:48 PM  ?

## 2021-11-12 NOTE — Progress Notes (Addendum)
[facility] PROGRESS NOTE   Judith Nguyen WUJ:[id number] DOB: Judith Griffins06-28-1997 DOA: 11/10/2021  2 DOS: the patient was seen and examined on 11/12/2021  PCP: Judith Nguyen, Judith Leeann, PA-C  Brief History and Hospital Course:  26 y.o. female with medical history significant for gastritis 4 to 5 years ago, who presented to the ER with acute onset of intermittent fever over the last 6 days with Tmax of 102.9.  She has also been experiencing pain in the right upper quadrant.  She underwent evaluation in the emergency department including right upper quadrant ultrasound, CT of the abdomen pelvis.  No clear abnormalities detected.  Patient was seen by gastroenterology and hospitalized for further management.    Consultants: Gastroenterology  Procedures: None yet    Subjective: Continues to have pain in the right upper quadrant, 6 out of 10 in intensity.  Slightly better compared to how it was at the time of admission.  Some nausea but no vomiting.  No other complaints offered.    Assessment/Plan:   * SIRS (systemic inflammatory response syndrome) (HCC) This is manifested by fever, tachycardia and tachypnea. No clear source of infection identified.  Blood cultures are pending.  WBC noted to be normal.  Procalcitonin less than 0.1.  Lactic acid level was normal.  UA unremarkable.  Chest x-ray did not show any acute findings. She is empirically on vancomycin, ceftriaxone and metronidazole. Hemodynamics are stable.  Heart rate is improved.  Fever has subsided. Blood c/s negative for 2 days. Will stop Vanc.  Right upper quadrant abdominal pain Etiology unclear.  Unremarkable.  Right upper quadrant ultrasound as well as CT of the abdomen pelvis did not show any abnormalities in the right upper quadrant.  No evidence for cholecystitis.  CT scan did not show any inflammatory changes in the right lower quadrant.  Appendix was not visualized. Seen by gastroenterology.  HIDA scan has been  ordered.  Hyponatremia Likely due to hypovolemia.  IV fluids were initiated.  Sodium level has improved.  Continue IV fluids for another 24 hours.  Normocytic anemia Drop in hemoglobin is likely dilutional.  No evidence of overt bleeding.     DVT Prophylaxis: Lovenox Code Status: Full code Family Communication: Discussed with patient and her husband Disposition Plan: Return home when improved  Status is: Inpatient Remains inpatient appropriate because: Persistent abdominal pain, fever     Medications: Scheduled:  enoxaparin (LOVENOX) injection  40 mg Subcutaneous Q24H   norethindrone-ethinyl estradiol-FE  1 tablet Oral Daily   potassium chloride  40 mEq Oral Once   Continuous:  sodium chloride 75 mL/hr at 11/12/21 0657   cefTRIAXone (ROCEPHIN)  IV 1 g (11/11/21 1734)   metronidazole 500 mg (11/12/21 1018)   NFA:OZHYQMVHQIONGPRN:acetaminophen **OR** acetaminophen, magnesium hydroxide, morphine injection, ondansetron **OR** ondansetron (ZOFRAN) IV, traZODone  Antibiotics: Anti-infectives (From admission, onward)    Start     Dose/Rate Route Frequency Ordered Stop   11/11/21 1700  cefTRIAXone (ROCEPHIN) 1 g in sodium chloride 0.9 % 100 mL IVPB        1 g 200 mL/hr over 30 Minutes Intravenous Every 24 hours 11/11/21 0012     11/11/21 0700  vancomycin (VANCOCIN) IVPB 1000 mg/200 mL premix  Status:  Discontinued        1,000 mg 200 mL/hr over 60 Minutes Intravenous Every 12 hours 11/10/21 1659 11/12/21 1555   11/11/21 0200  metroNIDAZOLE (FLAGYL) IVPB 500 mg        500 mg 100 mL/hr over 60 Minutes  Intravenous Every 8 hours 11/10/21 2337     11/10/21 2345  cefTRIAXone (ROCEPHIN) 1 g in sodium chloride 0.9 % 100 mL IVPB  Status:  Discontinued        1 g 200 mL/hr over 30 Minutes Intravenous Every 24 hours 11/10/21 2337 11/11/21 0011   11/10/21 1615  vancomycin (VANCOREADY) IVPB 1500 mg/300 mL        1,500 mg 150 mL/hr over 120 Minutes Intravenous  Once 11/10/21 1607 11/10/21 2203    11/10/21 1545  cefTRIAXone (ROCEPHIN) 2 g in sodium chloride 0.9 % 100 mL IVPB        2 g 200 mL/hr over 30 Minutes Intravenous  Once 11/10/21 1538 11/10/21 1752   11/10/21 1545  metroNIDAZOLE (FLAGYL) IVPB 500 mg        500 mg 100 mL/hr over 60 Minutes Intravenous  Once 11/10/21 1538 11/10/21 1851   11/10/21 1545  vancomycin (VANCOCIN) IVPB 1000 mg/200 mL premix  Status:  Discontinued        1,000 mg 200 mL/hr over 60 Minutes Intravenous  Once 11/10/21 1538 11/10/21 1607       Objective:  Vital Signs  Vitals:   11/11/21 2115 11/11/21 2116 11/12/21 0425 11/12/21 0731  BP: 122/81 122/81 123/76 125/83  Pulse: 92 92 90 74  Resp: 18 18 18 18   Temp: 98.2 F (36.8 C) 98.2 F (36.8 C) 98.3 F (36.8 C) 98 F (36.7 C)  TempSrc: Oral Oral Oral   SpO2: 100% 100% 99% 100%  Weight:      Height:        Intake/Output Summary (Last 24 hours) at 11/12/2021 1556 Last data filed at 11/12/2021 01/12/2022 Gross per 24 hour  Intake 2237.84 ml  Output --  Net 2237.84 ml   Filed Weights   11/11/21 0650  Weight: 67.1 kg    General appearance: Awake alert.  In no distress Resp: Clear to auscultation bilaterally.  Normal effort Cardio: S1-S2 is normal regular.  No S3-S4.  No rubs murmurs or bruit GI: Abdomen is soft.  Tenderness in the right upper quadrant and epigastric area appreciated.  No rebound rigidity or guarding.  No masses organomegaly. Extremities: No edema.  Full range of motion of lower extremities. Neurologic: Alert and oriented x3.  No focal neurological deficits.     Lab Results:  Data Reviewed: I have personally reviewed labs and imaging study reports  CBC: Recent Labs  Lab 11/10/21 1437 11/11/21 0643 11/12/21 0119  WBC 7.4 6.2 5.1  HGB 14.0 11.7* 11.8*  HCT 40.2 32.4* 33.8*  MCV 85.5 84.4 84.5  PLT 191 156 160    Basic Metabolic Panel: Recent Labs  Lab 11/10/21 1437 11/11/21 0643 11/12/21 0119  NA 132* 132* 136  K 4.1 3.8 3.5  CL 99 103 106  CO2 23 23 22    GLUCOSE 103* 140* 92  BUN 7 <5* <5*  CREATININE 0.82 0.66 0.65  CALCIUM 9.5 8.5* 8.6*    GFR: Estimated Creatinine Clearance: 111.4 mL/min (by C-G formula based on SCr of 0.65 mg/dL).  Liver Function Tests: Recent Labs  Lab 11/10/21 1437 11/11/21 0643 11/12/21 0119  AST 16 12* 12*  ALT 11 8 8   ALKPHOS 38 33* 29*  BILITOT 0.3 0.4 0.2*  PROT 7.8 6.3* 6.5  ALBUMIN 3.9 3.0* 3.1*    Recent Labs  Lab 11/10/21 1437  LIPASE 27     Coagulation Profile: Recent Labs  Lab 11/10/21 1613 11/11/21 0643  INR 1.0 1.1  Recent Results (from the past 240 hour(s))  Culture, group A strep     Status: None   Collection Time: 11/10/21  2:05 PM   Specimen: Throat  Result Value Ref Range Status   Specimen Description THROAT  Final   Special Requests   Final    NONE Performed at [facility], [address] [place], [place] [address]    Culture FEW STREPTOCOCCUS,BETA HEMOLYTIC NOT GROUP A  Final   Report Status 11/12/2021 FINAL  Final  Blood Culture (routine x 2)     Status: None (Preliminary result)   Collection Time: 11/10/21  4:18 PM   Specimen: BLOOD  Result Value Ref Range Status   Specimen Description BLOOD SITE NOT SPECIFIED  Final   Special Requests   Final    BOTTLES DRAWN AEROBIC AND ANAEROBIC Blood Culture adequate volume   Culture   Final    NO GROWTH 2 DAYS Performed at [facility], [address] [place], [place] [address]    Report Status PENDING  Incomplete  Blood Culture (routine x 2)     Status: None (Preliminary result)   Collection Time: 11/10/21  4:27 PM   Specimen: BLOOD  Result Value Ref Range Status   Specimen Description BLOOD SITE NOT SPECIFIED  Final   Special Requests   Final    BOTTLES DRAWN AEROBIC AND ANAEROBIC Blood Culture results may not be optimal due to an inadequate volume of blood received in culture bottles   Culture   Final    NO GROWTH 2 DAYS Performed at [facility], [address] [place], [place]  [address]    Report Status PENDING  Incomplete  Resp Panel by RT-PCR (Flu A&B, Covid) Nasopharyngeal Swab     Status: None   Collection Time: 11/10/21  7:10 PM   Specimen: Nasopharyngeal Swab; Nasopharyngeal(NP) swabs in vial transport medium  Result Value Ref Range Status   SARS Coronavirus 2 by RT PCR NEGATIVE NEGATIVE Final    Comment: (NOTE) SARS-CoV-2 target nucleic acids are NOT DETECTED.  The SARS-CoV-2 RNA is generally detectable in upper respiratory specimens during the acute phase of infection. The lowest concentration of SARS-CoV-2 viral copies this assay can detect is 138 copies/mL. A negative result does not preclude SARS-Cov-2 infection and should not be used as the sole basis for treatment or other patient management decisions. A negative result may occur with  improper specimen collection/handling, submission of specimen other than nasopharyngeal swab, presence of viral mutation(s) within the areas targeted by this assay, and inadequate number of viral copies(<138 copies/mL). A negative result must be combined with clinical observations, patient history, and epidemiological information. The expected result is Negative.  Fact Sheet for Patients:  BloggerCourse.com  Fact Sheet for Healthcare Providers:  SeriousBroker.it  This test is no t yet approved or cleared by the [place] FDA and  has been authorized for detection and/or diagnosis of SARS-CoV-2 by FDA under an Emergency Use Authorization (EUA). This EUA will remain  in effect (meaning this test can be used) for the duration of the COVID-19 declaration under Section 564(b)(1) of the Act, 21 U.S.C.section 360bbb-3(b)(1), unless the authorization is terminated  or revoked sooner.       Influenza A by PCR NEGATIVE NEGATIVE Final   Influenza B by PCR NEGATIVE NEGATIVE Final    Comment: (NOTE) The Xpert Xpress SARS-CoV-2/FLU/RSV plus assay is intended as an  aid in the diagnosis of influenza from Nasopharyngeal swab specimens  and should not be used as a sole basis for treatment. Nasal washings and aspirates are unacceptable for Xpert Xpress SARS-CoV-2/FLU/RSV testing.  Fact Sheet for Patients: BloggerCourse.com  Fact Sheet for Healthcare Providers: SeriousBroker.it  This test is not yet approved or cleared by the [place] FDA and has been authorized for detection and/or diagnosis of SARS-CoV-2 by FDA under an Emergency Use Authorization (EUA). This EUA will remain in effect (meaning this test can be used) for the duration of the COVID-19 declaration under Section 564(b)(1) of the Act, 21 U.S.C. section 360bbb-3(b)(1), unless the authorization is terminated or revoked.  Performed at [facility], [address], [place], [place] [address]   Urine Culture     Status: Abnormal (Preliminary result)   Collection Time: 11/10/21  9:10 PM   Specimen: In/Out Cath Urine  Result Value Ref Range Status   Specimen Description IN/OUT CATH URINE  Final   Special Requests NONE  Final   Culture (A)  Final    20,000 COLONIES/mL STAPHYLOCOCCUS HOMINIS SUSCEPTIBILITIES TO FOLLOW Performed at [facility], [address], [place], [place] [address]    Report Status PENDING  Incomplete      Radiology Studies: CT ABDOMEN PELVIS W CONTRAST  Result Date: 11/10/2021 CLINICAL DATA:  Right lower quadrant pain. EXAM: CT ABDOMEN AND PELVIS WITH CONTRAST TECHNIQUE: Multidetector CT imaging of the abdomen and pelvis was performed using the standard protocol following bolus administration of intravenous contrast. RADIATION DOSE REDUCTION: This exam was performed according to the departmental dose-optimization program which includes automated exposure control, adjustment of the mA and/or kV according to patient size and/or use of iterative reconstruction technique. CONTRAST:  OMNIPAQUE IOHEXOL  300 MG/ML  SOLN COMPARISON:  Ultrasound abdomen 01/08/2022. FINDINGS: Lower chest: No acute abnormality. Hepatobiliary: No focal liver abnormality is seen. No gallstones, gallbladder wall thickening, or biliary dilatation. Pancreas: Unremarkable. No pancreatic ductal dilatation or surrounding inflammatory changes. Spleen: Normal in size without focal abnormality. Adrenals/Urinary Tract: Adrenal glands are unremarkable. Kidneys are normal, without renal calculi, focal lesion, or hydronephrosis. Bladder is unremarkable. Stomach/Bowel: Stomach is within normal limits. Appendix not definitely visualized. There are no inflammatory changes surrounding the cecum. No evidence of bowel wall thickening, distention, or inflammatory changes. Vascular/Lymphatic: No significant vascular findings are present. No enlarged abdominal or pelvic lymph nodes. Reproductive: Uterus and bilateral adnexa are unremarkable. Other: There is a small amount of free fluid in the pelvis. There is no focal abdominal wall hernia or free air. Musculoskeletal: No acute or significant osseous findings. IMPRESSION: 1. Small amount of free fluid in the pelvis. 2. Appendix not definitely visualized. No inflammatory change in the right lower quadrant. Electronically Signed   By: Darliss Cheney M.D.   On: 11/10/2021 21:03   US Abdomen Limited RUQ (LIVER/GB)  Result Date: 11/10/2021 CLINICAL DATA:  Right upper quadrant pain, sepsis. EXAM: ULTRASOUND ABDOMEN LIMITED RIGHT UPPER QUADRANT COMPARISON:  Right upper quadrant ultrasound 10/13/2017. FINDINGS: Gallbladder: No gallstones or wall thickening visualized. No sonographic Murphy sign noted by sonographer. Common bile duct: Diameter: 4.0 mm. Liver: No focal lesion identified. Within normal limits in parenchymal echogenicity. Portal triad walls appear slightly echogenic. No ring down artifact. Portal vein is patent on color Doppler imaging with normal direction of blood flow towards the liver. Other: None.  IMPRESSION: 1. No cholelithiasis. 2. Portal triad walls appear echogenic. Findings are indeterminate, but can be seen in the setting of cholangitis. Pneumobilia is less likely given that there is no  ring down artifact. Consider further evaluation with CT or MRI. Electronically Signed   By: Darliss CheneyAmy  Guttmann M.D.   On: 11/10/2021 16:41       LOS: 2 days   Kikue Gerhart Rito EhrlichKrishnan  [facility] Pager on [email or web address]  11/12/2021, 3:56 PM

## 2021-11-12 NOTE — Consult Note (Signed)
Reason for Consult: Abdominal pain, nausea vomiting Referring Physician: Dr. Katherina Nguyen is an 26 y.o. female.  HPI: Patient is a 26 year old female who came to the ER 2 days ago secondary to fever, nausea, abdominal pain.  Patient states that she has had previous abdominal pain in the past.  She states that there is no previous work-up.  Upon evaluation in the hospital patient underwent GI consult, ultrasound, CT scan.  There is no sign significant for any cholecystitis, or gallstones.  Patient underwent HIDA scan as she continued to have nausea with p.o. intake.  Patient was found to have biliary dyskinesia with an ejection fraction of 13%.  General surgery was then consulted for further evaluation and management.  Patient had no other history of previous abdominal surgery.    History reviewed. No pertinent past medical history.  Past Surgical History:  Procedure Laterality Date   WISDOM TOOTH EXTRACTION      Family History  Problem Relation Age of Onset   Healthy Mother    Healthy Father     Social History:  reports that she has never smoked. She has never used smokeless tobacco. She reports that she does not drink alcohol and does not use drugs.  Allergies: No Known Allergies  Medications: I have reviewed the patient's current medications.  Results for orders placed or performed during the hospital encounter of 11/10/21 (from the past 48 hour(s))  Resp Panel by RT-PCR (Flu A&B, Covid) Nasopharyngeal Swab     Status: None   Collection Time: 11/10/21  7:10 PM   Specimen: Nasopharyngeal Swab; Nasopharyngeal(NP) swabs in vial transport medium  Result Value Ref Range   SARS Coronavirus 2 by RT PCR NEGATIVE NEGATIVE    Comment: (NOTE) SARS-CoV-2 target nucleic acids are NOT DETECTED.  The SARS-CoV-2 RNA is generally detectable in upper respiratory specimens during the acute phase of infection. The lowest concentration of SARS-CoV-2 viral copies this assay can detect  is 138 copies/mL. A negative result does not preclude SARS-Cov-2 infection and should not be used as the sole basis for treatment or other patient management decisions. A negative result may occur with  improper specimen collection/handling, submission of specimen other than nasopharyngeal swab, presence of viral mutation(s) within the areas targeted by this assay, and inadequate number of viral copies(<138 copies/mL). A negative result must be combined with clinical observations, patient history, and epidemiological information. The expected result is Negative.  Fact Sheet for Patients:  BloggerCourse.com  Fact Sheet for Healthcare Providers:  SeriousBroker.it  This test is no t yet approved or cleared by the [place] FDA and  has been authorized for detection and/or diagnosis of SARS-CoV-2 by FDA under an Emergency Use Authorization (EUA). This EUA will remain  in effect (meaning this test can be used) for the duration of the COVID-19 declaration under Section 564(b)(1) of the Act, 21 U.S.C.section 360bbb-3(b)(1), unless the authorization is terminated  or revoked sooner.       Influenza A by PCR NEGATIVE NEGATIVE   Influenza B by PCR NEGATIVE NEGATIVE    Comment: (NOTE) The Xpert Xpress SARS-CoV-2/FLU/RSV plus assay is intended as an aid in the diagnosis of influenza from Nasopharyngeal swab specimens and should not be used as a sole basis for treatment. Nasal washings and aspirates are unacceptable for Xpert Xpress SARS-CoV-2/FLU/RSV testing.  Fact Sheet for Patients: BloggerCourse.com  Fact Sheet for Healthcare Providers: SeriousBroker.it  This test is not yet approved or cleared by the [place] FDA and has  been authorized for detection and/or diagnosis of SARS-CoV-2 by FDA under an Emergency Use Authorization (EUA). This EUA will remain in effect (meaning this  test can be used) for the duration of the COVID-19 declaration under Section 564(b)(1) of the Act, 21 U.S.C. section 360bbb-3(b)(1), unless the authorization is terminated or revoked.  Performed at [facility], [address], [place], [place] [address]   Urinalysis, Routine w reflex microscopic     Status: Abnormal   Collection Time: 11/10/21  9:10 PM  Result Value Ref Range   Color, Urine STRAW (A) YELLOW   APPearance CLEAR CLEAR   Specific Gravity, Urine 1.017 1.005 - 1.030   pH 6.0 5.0 - 8.0   Glucose, UA NEGATIVE NEGATIVE mg/dL   Hgb urine dipstick NEGATIVE NEGATIVE   Bilirubin Urine NEGATIVE NEGATIVE   Ketones, ur 80 (A) NEGATIVE mg/dL   Protein, ur NEGATIVE NEGATIVE mg/dL   Nitrite NEGATIVE NEGATIVE   Leukocytes,Ua NEGATIVE NEGATIVE    Comment: Performed at [facility], [address], [place], [place] [address]  Urine Culture     Status: Abnormal (Preliminary result)   Collection Time: 11/10/21  9:10 PM   Specimen: In/Out Cath Urine  Result Value Ref Range   Specimen Description IN/OUT CATH URINE    Special Requests NONE    Culture (A)     20,000 COLONIES/mL STAPHYLOCOCCUS HOMINIS SUSCEPTIBILITIES TO FOLLOW Performed at [facility], [address] [place], [place] [address]    Report Status PENDING   HIV Antibody (routine testing w rflx)     Status: None   Collection Time: 11/11/21  6:43 AM  Result Value Ref Range   HIV Screen 4th Generation wRfx Non Reactive Non Reactive    Comment: Performed at [facility], [address], [place], [place] [address]  Protime-INR     Status: None   Collection Time: 11/11/21  6:43 AM  Result Value Ref Range   Prothrombin Time 13.7 11.4 - 15.2 seconds   INR 1.1 0.8 - 1.2    Comment: (NOTE) INR goal varies based on device and disease states. Performed at [facility], [address], [place], [place] [address]   Cortisol-am, blood     Status: Abnormal   Collection Time: 11/11/21  6:43 AM   Result Value Ref Range   Cortisol - AM 22.8 (H) 6.7 - 22.6 ug/dL    Comment: Performed at [facility], [address] [place], [place] [address]  Procalcitonin     Status: None   Collection Time: 11/11/21  6:43 AM  Result Value Ref Range   Procalcitonin <0.10 ng/mL    Comment:        Interpretation: PCT (Procalcitonin) <= 0.5 ng/mL: Systemic infection (sepsis) is not likely. Local bacterial infection is possible. (NOTE)       Sepsis PCT Algorithm           Lower Respiratory Tract                                      Infection PCT Algorithm    ----------------------------     ----------------------------         PCT < 0.25 ng/mL                PCT < 0.10 ng/mL          Strongly encourage  Strongly discourage   discontinuation of antibiotics    initiation of antibiotics    ----------------------------     -----------------------------       PCT 0.25 - 0.50 ng/mL            PCT 0.10 - 0.25 ng/mL               OR       >80% decrease in PCT            Discourage initiation of                                            antibiotics      Encourage discontinuation           of antibiotics    ----------------------------     -----------------------------         PCT >= 0.50 ng/mL              PCT 0.26 - 0.50 ng/mL               AND        <80% decrease in PCT             Encourage initiation of                                             antibiotics       Encourage continuation           of antibiotics    ----------------------------     -----------------------------        PCT >= 0.50 ng/mL                  PCT > 0.50 ng/mL               AND         increase in PCT                  Strongly encourage                                      initiation of antibiotics    Strongly encourage escalation           of antibiotics                                     -----------------------------                                           PCT <= 0.25 ng/mL                                                  OR                                        >  80% decrease in PCT                                      Discontinue / Do not initiate                                             antibiotics  Performed at [facility], [address] [place], [place] [address]   Basic metabolic panel     Status: Abnormal   Collection Time: 11/11/21  6:43 AM  Result Value Ref Range   Sodium 132 (L) 135 - 145 mmol/L   Potassium 3.8 3.5 - 5.1 mmol/L   Chloride 103 98 - 111 mmol/L   CO2 23 22 - 32 mmol/L   Glucose, Bld 140 (H) 70 - 99 mg/dL    Comment: Glucose reference range applies only to samples taken after fasting for at least 8 hours.   BUN <5 (L) 6 - 20 mg/dL   Creatinine, Ser 9.14 0.44 - 1.00 mg/dL   Calcium 8.5 (L) 8.9 - 10.3 mg/dL   GFR, Estimated >78 >29 mL/min    Comment: (NOTE) Calculated using the CKD-EPI Creatinine Equation (2021)    Anion gap 6 5 - 15    Comment: Performed at [facility], [address] [place], [place] [address]  CBC     Status: Abnormal   Collection Time: 11/11/21  6:43 AM  Result Value Ref Range   WBC 6.2 4.0 - 10.5 K/uL   RBC 3.84 (L) 3.87 - 5.11 MIL/uL   Hemoglobin 11.7 (L) 12.0 - 15.0 g/dL   HCT 08.6 (L) 57.8 - 46.9 %   MCV 84.4 80.0 - 100.0 fL   MCH 30.5 26.0 - 34.0 pg   MCHC 36.1 (H) 30.0 - 36.0 g/dL   RDW 62.9 (L) 52.8 - 41.3 %   Platelets 156 150 - 400 K/uL   nRBC 0.0 0.0 - 0.2 %    Comment: Performed at [facility], [address], [place], [place] [address]  Hepatic function panel     Status: Abnormal   Collection Time: 11/11/21  6:43 AM  Result Value Ref Range   Total Protein 6.3 (L) 6.5 - 8.1 g/dL   Albumin 3.0 (L) 3.5 - 5.0 g/dL   AST 12 (L) 15 - 41 U/L   ALT 8 0 - 44 U/L   Alkaline Phosphatase 33 (L) 38 - 126 U/L   Total Bilirubin 0.4 0.3 - 1.2 mg/dL   Bilirubin, Direct <0.2 0.0 - 0.2 mg/dL   Indirect Bilirubin NOT CALCULATED 0.3 - 0.9 mg/dL    Comment: Performed at [facility],  [address], [place], [place] [address]  CBC     Status: Abnormal   Collection Time: 11/12/21  1:19 AM  Result Value Ref Range   WBC 5.1 4.0 - 10.5 K/uL   RBC 4.00 3.87 - 5.11 MIL/uL   Hemoglobin 11.8 (L) 12.0 - 15.0 g/dL   HCT 64.4 (L) 03.4 - 74.2 %   MCV 84.5 80.0 - 100.0 fL   MCH 29.5 26.0 - 34.0 pg   MCHC 34.9 30.0 - 36.0 g/dL   RDW 59.5 63.8 - 75.6 %   Platelets 160 150 - 400 K/uL   nRBC 0.0  0.0 - 0.2 %    Comment: Performed at [facility], [address] [place], [place] [address]  Comprehensive metabolic panel     Status: Abnormal   Collection Time: 11/12/21  1:19 AM  Result Value Ref Range   Sodium 136 135 - 145 mmol/L   Potassium 3.5 3.5 - 5.1 mmol/L   Chloride 106 98 - 111 mmol/L   CO2 22 22 - 32 mmol/L   Glucose, Bld 92 70 - 99 mg/dL    Comment: Glucose reference range applies only to samples taken after fasting for at least 8 hours.   BUN <5 (L) 6 - 20 mg/dL   Creatinine, Ser 6.04 0.44 - 1.00 mg/dL   Calcium 8.6 (L) 8.9 - 10.3 mg/dL   Total Protein 6.5 6.5 - 8.1 g/dL   Albumin 3.1 (L) 3.5 - 5.0 g/dL   AST 12 (L) 15 - 41 U/L   ALT 8 0 - 44 U/L   Alkaline Phosphatase 29 (L) 38 - 126 U/L   Total Bilirubin 0.2 (L) 0.3 - 1.2 mg/dL   GFR, Estimated >54 >09 mL/min    Comment: (NOTE) Calculated using the CKD-EPI Creatinine Equation (2021)    Anion gap 8 5 - 15    Comment: Performed at [facility], [address] [place], [place] [address]    CT ABDOMEN PELVIS W CONTRAST  Result Date: 11/10/2021 CLINICAL DATA:  Nguyen lower quadrant pain. EXAM: CT ABDOMEN AND PELVIS WITH CONTRAST TECHNIQUE: Multidetector CT imaging of the abdomen and pelvis was performed using the standard protocol following bolus administration of intravenous contrast. RADIATION DOSE REDUCTION: This exam was performed according to the departmental dose-optimization program which includes automated exposure control, adjustment of the mA and/or kV according to patient size and/or use of  iterative reconstruction technique. CONTRAST:  OMNIPAQUE IOHEXOL 300 MG/ML  SOLN COMPARISON:  Ultrasound abdomen 01/08/2022. FINDINGS: Lower chest: No acute abnormality. Hepatobiliary: No focal liver abnormality is seen. No gallstones, gallbladder wall thickening, or biliary dilatation. Pancreas: Unremarkable. No pancreatic ductal dilatation or surrounding inflammatory changes. Spleen: Normal in size without focal abnormality. Adrenals/Urinary Tract: Adrenal glands are unremarkable. Kidneys are normal, without renal calculi, focal lesion, or hydronephrosis. Bladder is unremarkable. Stomach/Bowel: Stomach is within normal limits. Appendix not definitely visualized. There are no inflammatory changes surrounding the cecum. No evidence of bowel wall thickening, distention, or inflammatory changes. Vascular/Lymphatic: No significant vascular findings are present. No enlarged abdominal or pelvic lymph nodes. Reproductive: Uterus and bilateral adnexa are unremarkable. Other: There is a small amount of free fluid in the pelvis. There is no focal abdominal wall hernia or free air. Musculoskeletal: No acute or significant osseous findings. IMPRESSION: 1. Small amount of free fluid in the pelvis. 2. Appendix not definitely visualized. No inflammatory change in the Nguyen lower quadrant. Electronically Signed   By: Darliss Cheney M.D.   On: 11/10/2021 21:03   NM Hepato W/EF  Result Date: 11/12/2021 CLINICAL DATA:  Chronic upper abdominal pain, assess gallbladder motility. EXAM: NUCLEAR MEDICINE HEPATOBILIARY IMAGING WITH GALLBLADDER EF TECHNIQUE: Sequential images of the abdomen were obtained out to 60 minutes following intravenous administration of radiopharmaceutical. After oral ingestion of Ensure, gallbladder ejection fraction was determined. At 60 min, normal ejection fraction is greater than 33%. RADIOPHARMACEUTICALS:  5.1 mCi Tc-41m  Choletec IV COMPARISON:  CT and ultrasound November 10, 2021 FINDINGS: Prompt uptake  and biliary excretion of activity by the liver is seen. Gallbladder activity is visualized, consistent with patency  of cystic duct. Biliary activity passes into small bowel, consistent with patent common bile duct. Calculated gallbladder ejection fraction is 13%. (Normal gallbladder ejection fraction with Ensure is greater than 33%.) IMPRESSION: Reduced gallbladder ejection fraction as can be seen with biliary dyskinesia/chronic cholecystitis. Electronically Signed   By: Maudry Mayhew M.D.   On: 11/12/2021 16:50    Review of Systems  Constitutional: Negative.   HENT: Negative.    Respiratory: Negative.    Cardiovascular: Negative.   Gastrointestinal: Negative.   Neurological: Negative.   All other systems reviewed and are negative. Blood pressure 123/71, pulse 89, temperature 97.8 F (36.6 C), resp. rate 18, height 5\' 9"  (1.753 m), weight 67.1 kg, last menstrual period 10/13/2021, SpO2 100 %. Physical Exam Constitutional:      Appearance: She is well-developed.     Comments: Conversant No acute distress  HENT:     Head: Normocephalic and atraumatic.  Eyes:     General: Lids are normal. No scleral icterus.    Pupils: Pupils are equal, round, and reactive to light.     Comments: Pupils are equal round and reactive No lid lag Moist conjunctiva  Neck:     Thyroid: No thyromegaly.     Trachea: No tracheal tenderness.     Comments: No cervical lymphadenopathy Cardiovascular:     Rate and Rhythm: Normal rate and regular rhythm.     Heart sounds: No murmur heard. Pulmonary:     Effort: Pulmonary effort is normal.     Breath sounds: Normal breath sounds. No wheezing or rales.  Abdominal:     Tenderness: There is abdominal tenderness in the Nguyen upper quadrant. There is no guarding or rebound.     Hernia: No hernia is present.  Musculoskeletal:     Cervical back: Normal range of motion and neck supple.  Skin:    General: Skin is warm.     Findings: No rash.     Nails: There is no  clubbing.     Comments: Normal skin turgor  Neurological:     Mental Status: She is alert and oriented to person, place, and time.     Comments: Normal gait and station  Psychiatric:        Mood and Affect: Mood normal.        Thought Content: Thought content normal.        Judgment: Judgment normal.     Comments: Appropriate affect    Assessment/Plan: Patient is a 26 year old female with likely biliary dyskinesia.  Patient with appears to be separate viral illness.  She appears to be recovering well from this at this point.  Patient continues with nausea, decreased tolerance to p.o. At this point would recommend lap chole for biliary dyskinesia. 1.  I discussed with Dr. Dwain Sarna in the a.m. in regards to lap chole. 2. All risks and benefits were discussed with the patient to generally include: infection, bleeding, possible need for post op ERCP, damage to the bile ducts, and bile leak. Alternatives were offered and described.  All questions were answered and the patient voiced understanding of the procedure and wishes to proceed at this point with a laparoscopic cholecystectomy   Axel Filler 11/12/2021, 6:32 PM

## 2021-11-13 DIAGNOSIS — R6510 Systemic inflammatory response syndrome (SIRS) of non-infectious origin without acute organ dysfunction: Secondary | ICD-10-CM | POA: Diagnosis not present

## 2021-11-13 DIAGNOSIS — Z6826 Body mass index (BMI) 26.0-26.9, adult: Secondary | ICD-10-CM

## 2021-11-13 LAB — SURGICAL PCR SCREEN
MRSA, PCR: NEGATIVE
Staphylococcus aureus: POSITIVE — AB

## 2021-11-13 LAB — COMPREHENSIVE METABOLIC PANEL
ALT: 17 U/L (ref 0–44)
AST: 26 U/L (ref 15–41)
Albumin: 3.2 g/dL — ABNORMAL LOW (ref 3.5–5.0)
Alkaline Phosphatase: 32 U/L — ABNORMAL LOW (ref 38–126)
Anion gap: 11 (ref 5–15)
BUN: 5 mg/dL — ABNORMAL LOW (ref 6–20)
CO2: 21 mmol/L — ABNORMAL LOW (ref 22–32)
Calcium: 9 mg/dL (ref 8.9–10.3)
Chloride: 104 mmol/L (ref 98–111)
Creatinine, Ser: 0.6 mg/dL (ref 0.44–1.00)
GFR, Estimated: >60 mL/min (ref >60)
Glucose, Bld: 74 mg/dL (ref 70–99)
Potassium: 4.1 mmol/L (ref 3.5–5.1)
Sodium: 136 mmol/L (ref 135–145)
Total Bilirubin: 0.2 mg/dL — ABNORMAL LOW (ref 0.3–1.2)
Total Protein: 7.1 g/dL (ref 6.5–8.1)

## 2021-11-13 LAB — CBC
HCT: 36.3 % (ref 36.0–46.0)
Hemoglobin: 12.4 g/dL (ref 12.0–15.0)
MCH: 29.2 pg (ref 26.0–34.0)
MCHC: 34.2 g/dL (ref 30.0–36.0)
MCV: 85.4 fL (ref 80.0–100.0)
Platelets: 200 10*3/uL (ref 150–400)
RBC: 4.25 MIL/uL (ref 3.87–5.11)
RDW: 11.5 % (ref 11.5–15.5)
WBC: 4.2 10*3/uL (ref 4.0–10.5)
nRBC: 0 % (ref 0.0–0.2)

## 2021-11-13 LAB — URINE CULTURE: Culture: 20000 — AB

## 2021-11-13 MED ORDER — MUPIROCIN 2 % EX OINT
1.0000 "application " | TOPICAL_OINTMENT | Freq: Two times a day (BID) | CUTANEOUS | Status: DC
  Administered 2021-11-13: 1 via NASAL
  Filled 2021-11-13: qty 22

## 2021-11-13 NOTE — Progress Notes (Signed)
? ?Subjective/Chief Complaint: ?Some upper ab pain ? ? ?Objective: ?Vital signs in last 24 hours: ?Temp:  [97.8 ?F (36.6 ?C)-98.6 ?F (37 ?C)] 98.6 ?F (37 ?C) (03/08 9201) ?Pulse Rate:  [70-97] 97 (03/08 0723) ?Resp:  [16-18] 16 (03/08 0723) ?BP: (121-140)/(70-91) 140/81 (03/08 0723) ?SpO2:  [99 %-100 %] 99 % (03/08 0723) ?Last BM Date : 11/10/21 ? ?Intake/Output from previous day: ?03/07 0701 - 03/08 0700 ?In: 636 [I.V.:636] ?Out: -  ?Intake/Output this shift: ?No intake/output data recorded. ? ?GI: soft mild tender ruq and epigastrium ? ?Lab Results:  ?Recent Labs  ?  11/12/21 ?0119 11/13/21 ?0129  ?WBC 5.1 4.2  ?HGB 11.8* 12.4  ?HCT 33.8* 36.3  ?PLT 160 200  ? ?BMET ?Recent Labs  ?  11/12/21 ?0119 11/13/21 ?0129  ?NA 136 136  ?K 3.5 4.1  ?CL 106 104  ?CO2 22 21*  ?GLUCOSE 92 74  ?BUN <5* 5*  ?CREATININE 0.65 0.60  ?CALCIUM 8.6* 9.0  ? ?PT/INR ?Recent Labs  ?  11/10/21 ?1613 11/11/21 ?0643  ?LABPROT 13.2 13.7  ?INR 1.0 1.1  ? ?ABG ?No results for input(s): PHART, HCO3 in the last 72 hours. ? ?Invalid input(s): PCO2, PO2 ? ?Studies/Results: ?NM Hepato W/EF ? ?Result Date: 11/12/2021 ?CLINICAL DATA:  Chronic upper abdominal pain, assess gallbladder motility. EXAM: NUCLEAR MEDICINE HEPATOBILIARY IMAGING WITH GALLBLADDER EF TECHNIQUE: Sequential images of the abdomen were obtained out to 60 minutes following intravenous administration of radiopharmaceutical. After oral ingestion of Ensure, gallbladder ejection fraction was determined. At 60 min, normal ejection fraction is greater than 33%. RADIOPHARMACEUTICALS:  5.1 mCi Tc-55m  Choletec IV COMPARISON:  CT and ultrasound November 10, 2021 FINDINGS: Prompt uptake and biliary excretion of activity by the liver is seen. Gallbladder activity is visualized, consistent with patency of cystic duct. Biliary activity passes into small bowel, consistent with patent common bile duct. Calculated gallbladder ejection fraction is 13%. (Normal gallbladder ejection fraction with Ensure  is greater than 33%.) IMPRESSION: Reduced gallbladder ejection fraction as can be seen with biliary dyskinesia/chronic cholecystitis. Electronically Signed   By: Maudry Mayhew M.D.   On: 11/12/2021 16:50   ? ?Anti-infectives: ?Anti-infectives (From admission, onward)  ? ? Start     Dose/Rate Route Frequency Ordered Stop  ? 11/11/21 1700  cefTRIAXone (ROCEPHIN) 1 g in sodium chloride 0.9 % 100 mL IVPB       ? 1 g ?200 mL/hr over 30 Minutes Intravenous Every 24 hours 11/11/21 0012    ? 11/11/21 0700  vancomycin (VANCOCIN) IVPB 1000 mg/200 mL premix  Status:  Discontinued       ? 1,000 mg ?200 mL/hr over 60 Minutes Intravenous Every 12 hours 11/10/21 1659 11/12/21 1555  ? 11/11/21 0200  metroNIDAZOLE (FLAGYL) IVPB 500 mg       ? 500 mg ?100 mL/hr over 60 Minutes Intravenous Every 8 hours 11/10/21 2337    ? 11/10/21 2345  cefTRIAXone (ROCEPHIN) 1 g in sodium chloride 0.9 % 100 mL IVPB  Status:  Discontinued       ? 1 g ?200 mL/hr over 30 Minutes Intravenous Every 24 hours 11/10/21 2337 11/11/21 0011  ? 11/10/21 1615  vancomycin (VANCOREADY) IVPB 1500 mg/300 mL       ? 1,500 mg ?150 mL/hr over 120 Minutes Intravenous  Once 11/10/21 1607 11/10/21 2203  ? 11/10/21 1545  cefTRIAXone (ROCEPHIN) 2 g in sodium chloride 0.9 % 100 mL IVPB       ? 2 g ?200 mL/hr over 30  Minutes Intravenous  Once 11/10/21 1538 11/10/21 1752  ? 11/10/21 1545  metroNIDAZOLE (FLAGYL) IVPB 500 mg       ? 500 mg ?100 mL/hr over 60 Minutes Intravenous  Once 11/10/21 1538 11/10/21 1851  ? 11/10/21 1545  vancomycin (VANCOCIN) IVPB 1000 mg/200 mL premix  Status:  Discontinued       ? 1,000 mg ?200 mL/hr over 60 Minutes Intravenous  Once 11/10/21 1538 11/10/21 1607  ? ?  ? ? ?Assessment/Plan: ?Abd pain ?Recent viral syndrome ?-her [place] is normal. I don't think that the HIDA scan as inpatient test is valid-the EF in particular is not valid given all other things going on.  This is a dynamic test that can be altered by multiple factors. I think at best with  data available for biliary dyskinesia it is 50:50 she will be better with surgery.  I think best option is to left have full liquids today and go home. She can advance at home. I can see her back in two weeks. Also think and EGD prior to surgery is a consideration for her and would want to pursue that. I discussed this plan with she and her husband and they are in agreement. ? ? ?I reviewed vitals, GI notes, [place], CMP, CBC and HIDA scan today. I have independently reviewed the [place] and hida scan as well.  I have messaged GI about her.  ?Emelia Loron ?11/13/2021 ? ?

## 2021-11-13 NOTE — Discharge Summary (Signed)
Physician Discharge Summary  Judith Nguyen:[id number] DOB: 1996-07-25 DOA: 11/10/2021  PCP: Isabella Bowens, PA-C  Admit date: 11/10/2021 Discharge date: 11/13/2021  Admitted From: Home Discharge disposition: Home  Recommendations at discharge:  Follow-up with GI and surgery as an outpatient.  History of Present Illness / Brief narrative:  Patient is a 26 y.o. female with medical history significant for gastritis 4 to 5 years ago, who presented to the ER with acute onset of intermittent fever over the last 6 days with Tmax of 102.9.  She also experienced pain in the right upper quadrant.  She underwent evaluation in the emergency department including right upper quadrant ultrasound, CT of the abdomen pelvis.  No clear abnormalities was detected.  Patient was seen by gastroenterology and hospitalized for further management.    Subjective:  Patient was seen and examined this morning.  Sitting up in chair.  Not in distress. GI and general surgery recommendation appreciated.  Hospital Course:  SIRS (systemic inflammatory response syndrome) -Presented with fever, tachycardia and tachypnea. -Normal WBC, procalcitonin level normal.  Lactic acid level normal.  UA unremarkable.  Chest x-ray did not show any acute findings. -Patient was started on broad-spectrum antibiotics. -Hemodynamically stable.  Fever subsided. -No growth in culture.  Patient probably had viral syndrome.  Do not think patient needs to continue antibiotics at discharge. Recent Labs  Lab 11/10/21 1437 11/10/21 1440 11/10/21 1547 11/11/21 0643 11/12/21 0119 11/13/21 0129  WBC 7.4  --   --  6.2 5.1 4.2  LATICACIDVEN  --  1.0 1.1  --   --   --   PROCALCITON  --   --   --  <0.10  --   --     Right upper quadrant abdominal pain -Etiology unclear. Unremarkable right upper quadrant ultrasound as well as CT of the abdomen pelvis did not show any abnormalities in the right upper quadrant.  No evidence for  cholecystitis.  -CT scan did not show any inflammatory changes in the right lower quadrant.  Appendix was not visualized. Seen by gastroenterology.  HIDA scan 3/7 showed reduced gallbladder ejection fraction.  General surgery consultation was obtained.  Per Dr. Dwain Sarna, HIDA scan is probably false positive. Patient is not indicated for cholecystectomy at this time. Patient will follow-up with general surgery as an outpatient in 2 weeks. -I checked with Dr. Elnoria Howard.  Patient will follow up with him as an outpatient as well.   Hyponatremia -Likely due to hypovolemia.  IV fluids were initiated.  Sodium level has improved.  Continue IV fluids for another 24 hours. Recent Labs  Lab 11/10/21 1437 11/11/21 0643 11/12/21 0119 11/13/21 0129  NA 132* 132* 136 136   Normocytic anemia -Drop in hemoglobin is likely dilutional.  No evidence of overt bleeding. Recent Labs    11/10/21 1437 11/11/21 0643 11/12/21 0119 11/13/21 0129  HGB 14.0 11.7* 11.8* 12.4  MCV 85.5 84.4 84.5 85.4   Goals of care --  Code Status: Full Code   Nutritional status:  Body mass index is 21.86 kg/m.      Diet:  Diet Order             Diet full liquid Room service appropriate? Yes; Fluid consistency: Thin  Diet effective now           Diet general                    Wounds: -    Discharge Exam:  Vitals:   11/12/21 2107 11/12/21 2109 11/13/21 0600 11/13/21 0723  BP: (!) 133/91 (!) 133/91 121/70 140/81  Pulse: 70 78 86 97  Resp: 18 18  16   Temp: 98.1 F (36.7 C) 98.1 F (36.7 C) 98.1 F (36.7 C) 98.6 F (37 C)  TempSrc: Oral Oral  Oral  SpO2: 99% 99% 99% 99%  Weight:      Height:        Body mass index is 21.86 kg/m.  General exam: Pleasant, young Caucasian female.  Not in physical distress Skin: No rashes, lesions or ulcers. HEENT: Atraumatic, normocephalic, no obvious bleeding Lungs: Clear to auscultation bilaterally CVS: Regular rate and rhythm, no murmur GI/Abd soft,  nontender, nondistended, bowels are present CNS: Alert, awake, oriented x3 Psychiatry: Mood appropriate Extremities: No pedal edema, no calf tenderness  Follow ups:    Follow-up Information     , PA-C Follow up.   Specialty: Family Medicine Contact information: [address] [place] [phone]         [phone], MD Follow up.   Specialty: General Surgery Contact information: [address] [place] [phone]         [phone], MD Follow up.   Specialty: Gastroenterology Contact information: [address] [place] [phone]                 Discharge Instructions:   Discharge Instructions     Call MD for:  difficulty breathing, headache or visual disturbances   Complete by: As directed    Call MD for:  extreme fatigue   Complete by: As directed    Call MD for:  hives   Complete by: As directed    Call MD for:  persistant dizziness or light-headedness   Complete by: As directed    Call MD for:  persistant nausea and vomiting   Complete by: As directed    Call MD for:  severe uncontrolled pain   Complete by: As directed    Call MD for:  temperature >100.4   Complete by: As directed    Diet general   Complete by: As directed    Discharge instructions   Complete by: As directed    General discharge instructions: Follow with Primary MD [phone], PA-C in 7 days  Please request your PCP  to go over your hospital tests, procedures, radiology results at the follow up. Please get your medicines reviewed and adjusted.  Your PCP may decide to repeat certain labs or tests as needed. Do not drive, operate heavy machinery, perform activities at heights, swimming or participation in water activities or provide baby sitting services if your were admitted for syncope or siezures until you have seen by Primary MD or a Neurologist and advised to do  so again. [place] Controlled Substance Reporting System database was reviewed. Do not drive, operate heavy machinery, perform activities at heights, swim, participate in water activities or provide baby-sitting services while on medications for pain, sleep and mood until your outpatient physician has reevaluated you and advised to do so again.  You are strongly recommended to comply with the dose, frequency and duration of prescribed medications. Activity: As tolerated with Full fall precautions use walker/cane & assistance as needed Avoid using any recreational substances like cigarette, tobacco, alcohol, or non-prescribed drug. If you experience worsening of your admission symptoms, develop shortness of breath, life threatening emergency, suicidal or homicidal thoughts  you must seek medical attention immediately by calling 911 or calling your MD immediately  if symptoms less severe. You must read complete instructions/literature along with all the possible adverse reactions/side effects for all the medicines you take and that have been prescribed to you. Take any new medicine only after you have completely understood and accepted all the possible adverse reactions/side effects.  Wear Seat belts while driving. You were cared for by a hospitalist during your hospital stay. If you have any questions about your discharge medications or the care you received while you were in the hospital after you are discharged, you can call the unit and ask to speak with the hospitalist or the covering physician. Once you are discharged, your primary care physician will handle any further medical issues. Please note that NO REFILLS for any discharge medications will be authorized once you are discharged, as it is imperative that you return to your primary care physician (or establish a relationship with a primary care physician if you do not have one).   Increase activity slowly   Complete by: As directed         Discharge Medications:   Allergies as of 11/13/2021   No Known Allergies      Medication List     TAKE these medications    norethindrone-ethinyl estradiol-FE 1-20 MG-MCG tablet Commonly known as: LOESTRIN FE Take 1 tablet by mouth daily.         The results of significant diagnostics from this hospitalization (including imaging, microbiology, ancillary and laboratory) are listed below for reference.    Procedures and Diagnostic Studies:   CT ABDOMEN PELVIS W CONTRAST  Result Date: 11/10/2021 CLINICAL DATA:  Right lower quadrant pain. EXAM: CT ABDOMEN AND PELVIS WITH CONTRAST TECHNIQUE: Multidetector CT imaging of the abdomen and pelvis was performed using the standard protocol following bolus administration of intravenous contrast. RADIATION DOSE REDUCTION: This exam was performed according to the departmental dose-optimization program which includes automated exposure control, adjustment of the mA and/or kV according to patient size and/or use of iterative reconstruction technique. CONTRAST:  OMNIPAQUE IOHEXOL 300 MG/ML  SOLN COMPARISON:  Ultrasound abdomen 01/08/2022. FINDINGS: Lower chest: No acute abnormality. Hepatobiliary: No focal liver abnormality is seen. No gallstones, gallbladder wall thickening, or biliary dilatation. Pancreas: Unremarkable. No pancreatic ductal dilatation or surrounding inflammatory changes. Spleen: Normal in size without focal abnormality. Adrenals/Urinary Tract: Adrenal glands are unremarkable. Kidneys are normal, without renal calculi, focal lesion, or hydronephrosis. Bladder is unremarkable. Stomach/Bowel: Stomach is within normal limits. Appendix not definitely visualized. There are no inflammatory changes surrounding the cecum. No evidence of bowel wall thickening, distention, or inflammatory changes. Vascular/Lymphatic: No significant vascular findings are present. No enlarged abdominal or pelvic lymph nodes. Reproductive: Uterus and bilateral  adnexa are unremarkable. Other: There is a small amount of free fluid in the pelvis. There is no focal abdominal wall hernia or free air. Musculoskeletal: No acute or significant osseous findings. IMPRESSION: 1. Small amount of free fluid in the pelvis. 2. Appendix not definitely visualized. No inflammatory change in the right lower quadrant. Electronically Signed   By: Darliss Cheney M.D.   On: 11/10/2021 21:03   DG Chest Port 1 View  Result Date: 11/10/2021 CLINICAL DATA:  Questionable sepsis - evaluate for abnormality EXAM: PORTABLE CHEST 1 VIEW COMPARISON:  None. FINDINGS: The cardiomediastinal silhouette is normal in contour. No pleural effusion. No pneumothorax. No acute pleuroparenchymal abnormality. Visualized abdomen is unremarkable. No acute osseous abnormality noted. IMPRESSION: No acute  cardiopulmonary abnormality. Electronically Signed   By: Meda Klinefelter M.D.   On: 11/10/2021 16:00   US Abdomen Limited RUQ (LIVER/GB)  Result Date: 11/10/2021 CLINICAL DATA:  Right upper quadrant pain, sepsis. EXAM: ULTRASOUND ABDOMEN LIMITED RIGHT UPPER QUADRANT COMPARISON:  Right upper quadrant ultrasound 10/13/2017. FINDINGS: Gallbladder: No gallstones or wall thickening visualized. No sonographic Murphy sign noted by sonographer. Common bile duct: Diameter: 4.0 mm. Liver: No focal lesion identified. Within normal limits in parenchymal echogenicity. Portal triad walls appear slightly echogenic. No ring down artifact. Portal vein is patent on color Doppler imaging with normal direction of blood flow towards the liver. Other: None. IMPRESSION: 1. No cholelithiasis. 2. Portal triad walls appear echogenic. Findings are indeterminate, but can be seen in the setting of cholangitis. Pneumobilia is less likely given that there is no ring down artifact. Consider further evaluation with CT or MRI. Electronically Signed   By: Darliss Cheney M.D.   On: 11/10/2021 16:41     Labs:   Basic Metabolic Panel: Recent  Labs  Lab 11/10/21 1437 11/11/21 0643 11/12/21 0119 11/13/21 0129  NA 132* 132* 136 136  K 4.1 3.8 3.5 4.1  CL 99 103 106 104  CO2 21*  GLUCOSE 103* 140* 92 74  BUN 7 <5* <5* 5*  CREATININE 0.82 0.66 0.65 0.60  CALCIUM 9.5 8.5* 8.6* 9.0   GFR Estimated Creatinine Clearance: 111.4 mL/min (by C-G formula based on SCr of 0.6 mg/dL). Liver Function Tests: Recent Labs  Lab 11/10/21 1437 11/11/21 0643 11/12/21 0119 11/13/21 0129  AST 16 12* 12* 26  ALT ALKPHOS 38 33* 29* 32*  BILITOT 0.3 0.4 0.2* 0.2*  PROT 7.8 6.3* 6.5 7.1  ALBUMIN 3.9 3.0* 3.1* 3.2*   Recent Labs  Lab 11/10/21 1437  LIPASE 27   No results for input(s): AMMONIA in the last 168 hours. Coagulation profile Recent Labs  Lab 11/10/21 1613 11/11/21 0643  INR 1.0 1.1    CBC: Recent Labs  Lab 11/10/21 1437 11/11/21 0643 11/12/21 0119 11/13/21 0129  WBC 7.4 6.2 5.1 4.2  HGB 14.0 11.7* 11.8* 12.4  HCT 40.2 32.4* 33.8* 36.3  MCV 85.5 84.4 84.5 85.4  PLT 191 156 160 200   Cardiac Enzymes: No results for input(s): CKTOTAL, CKMB, CKMBINDEX, TROPONINI in the last 168 hours. BNP: Invalid input(s): POCBNP CBG: No results for input(s): GLUCAP in the last 168 hours. D-Dimer No results for input(s): DDIMER in the last 72 hours. Hgb A1c No results for input(s): HGBA1C in the last 72 hours. Lipid Profile No results for input(s): CHOL, HDL, LDLCALC, TRIG, CHOLHDL, LDLDIRECT in the last 72 hours. Thyroid function studies No results for input(s): TSH, T4TOTAL, T3FREE, THYROIDAB in the last 72 hours.  Invalid input(s): FREET3 Anemia work up No results for input(s): VITAMINB12, FOLATE, FERRITIN, TIBC, IRON, RETICCTPCT in the last 72 hours. Microbiology Recent Results (from the past 240 hour(s))  Culture, group A strep     Status: None   Collection Time: 11/10/21  2:05 PM   Specimen: Throat  Result Value Ref Range Status   Specimen Description THROAT  Final   Special Requests    Final    NONE Performed at [facility], [address] [place], [place] [address]    Culture FEW STREPTOCOCCUS,BETA HEMOLYTIC NOT GROUP A  Final   Report Status 11/12/2021 FINAL  Final  Blood Culture (routine x 2)     Status: None (Preliminary result)  Collection Time: 11/10/21  4:18 PM   Specimen: BLOOD  Result Value Ref Range Status   Specimen Description BLOOD SITE NOT SPECIFIED  Final   Special Requests   Final    BOTTLES DRAWN AEROBIC AND ANAEROBIC Blood Culture adequate volume   Culture   Final    NO GROWTH 3 DAYS Performed at [facility], [address] [place], [place] [address]    Report Status PENDING  Incomplete  Blood Culture (routine x 2)     Status: None (Preliminary result)   Collection Time: 11/10/21  4:27 PM   Specimen: BLOOD  Result Value Ref Range Status   Specimen Description BLOOD SITE NOT SPECIFIED  Final   Special Requests   Final    BOTTLES DRAWN AEROBIC AND ANAEROBIC Blood Culture results may not be optimal due to an inadequate volume of blood received in culture bottles   Culture   Final    NO GROWTH 3 DAYS Performed at [facility], [address] [place], [place] [address]    Report Status PENDING  Incomplete  Resp Panel by RT-PCR (Flu A&B, Covid) Nasopharyngeal Swab     Status: None   Collection Time: 11/10/21  7:10 PM   Specimen: Nasopharyngeal Swab; Nasopharyngeal(NP) swabs in vial transport medium  Result Value Ref Range Status   SARS Coronavirus 2 by RT PCR NEGATIVE NEGATIVE Final    Comment: (NOTE) SARS-CoV-2 target nucleic acids are NOT DETECTED.  The SARS-CoV-2 RNA is generally detectable in upper respiratory specimens during the acute phase of infection. The lowest concentration of SARS-CoV-2 viral copies this assay can detect is 138 copies/mL. A negative result does not preclude SARS-Cov-2 infection and should not be used as the sole basis for treatment or other patient management decisions. A negative result may  occur with  improper specimen collection/handling, submission of specimen other than nasopharyngeal swab, presence of viral mutation(s) within the areas targeted by this assay, and inadequate number of viral copies(<138 copies/mL). A negative result must be combined with clinical observations, patient history, and epidemiological information. The expected result is Negative.  Fact Sheet for Patients:  BloggerCourse.comhttps://www.fda.gov/media/152166/download  Fact Sheet for Healthcare Providers:  SeriousBroker.ithttps://www.fda.gov/media/152162/download  This test is no t yet approved or cleared by the [place] FDA and  has been authorized for detection and/or diagnosis of SARS-CoV-2 by FDA under an Emergency Use Authorization (EUA). This EUA will remain  in effect (meaning this test can be used) for the duration of the COVID-19 declaration under Section 564(b)(1) of the Act, 21 U.S.C.section 360bbb-3(b)(1), unless the authorization is terminated  or revoked sooner.       Influenza A by PCR NEGATIVE NEGATIVE Final   Influenza B by PCR NEGATIVE NEGATIVE Final    Comment: (NOTE) The Xpert Xpress SARS-CoV-2/FLU/RSV plus assay is intended as an aid in the diagnosis of influenza from Nasopharyngeal swab specimens and should not be used as a sole basis for treatment. Nasal washings and aspirates are unacceptable for Xpert Xpress SARS-CoV-2/FLU/RSV testing.  Fact Sheet for Patients: BloggerCourse.comhttps://www.fda.gov/media/152166/download  Fact Sheet for Healthcare Providers: SeriousBroker.ithttps://www.fda.gov/media/152162/download  This test is not yet approved or cleared by the [place] FDA and has been authorized for detection and/or diagnosis of SARS-CoV-2 by FDA under an Emergency Use Authorization (EUA). This EUA will remain in effect (meaning this test can be used) for the duration of the COVID-19 declaration under Section 564(b)(1) of the Act, 21 U.S.C. section 360bbb-3(b)(1), unless the authorization is terminated  or revoked.  Performed at [facility], [address], [place], [place] [address]   Urine Culture     Status: Abnormal   Collection Time: 11/10/21  9:10 PM   Specimen: In/Out Cath Urine  Result Value Ref Range Status   Specimen Description IN/OUT CATH URINE  Final   Special Requests   Final    NONE Performed at [facility], [address], [place], [place] [address]    Culture 20,000 COLONIES/mL STAPHYLOCOCCUS HOMINIS (A)  Final   Report Status 11/13/2021 FINAL  Final   Organism ID, Bacteria STAPHYLOCOCCUS HOMINIS (A)  Final      Susceptibility   Staphylococcus hominis - MIC*    CIPROFLOXACIN <=0.5 SENSITIVE Sensitive     GENTAMICIN <=0.5 SENSITIVE Sensitive     NITROFURANTOIN <=16 SENSITIVE Sensitive     OXACILLIN <=0.25 SENSITIVE Sensitive     TETRACYCLINE <=1 SENSITIVE Sensitive     VANCOMYCIN 1 SENSITIVE Sensitive     TRIMETH/SULFA <=10 SENSITIVE Sensitive     CLINDAMYCIN <=0.25 SENSITIVE Sensitive     RIFAMPIN <=0.5 SENSITIVE Sensitive     Inducible Clindamycin NEGATIVE Sensitive     * 20,000 COLONIES/mL STAPHYLOCOCCUS HOMINIS  Surgical PCR screen     Status: Abnormal   Collection Time: 11/12/21 10:31 PM   Specimen: Nasal Mucosa; Nasal Swab  Result Value Ref Range Status   MRSA, PCR NEGATIVE NEGATIVE Final   Staphylococcus aureus POSITIVE (A) NEGATIVE Final    Comment: (NOTE) The Xpert SA Assay (FDA approved for NASAL specimens in patients 48 years of age and older), is one component of a comprehensive surveillance program. It is not intended to diagnose infection nor to guide or monitor treatment. Performed at [facility], [address], [place], [place] [address]     Time coordinating discharge: 35 minutes  Signed: Laityn Bensen  [facility] 11/13/2021, 12:47 PM

## 2021-11-15 LAB — CULTURE, BLOOD (ROUTINE X 2)
Culture: NO GROWTH
Culture: NO GROWTH
Special Requests: ADEQUATE

## 2022-08-15 IMAGING — NM NM HEPATO W/GB/PHARM/[PERSON_NAME]
3 series · 18 of 18 positions shown · non-contrast
Comparison: CT and ultrasound November 10, 2021

CLINICAL DATA: Chronic upper abdominal pain, assess gallbladder
motility.

EXAM:
NUCLEAR MEDICINE HEPATOBILIARY IMAGING WITH GALLBLADDER EF
TECHNIQUE: Sequential images of the abdomen were obtained [DATE] minutes
following intravenous administration of radiopharmaceutical. After
oral ingestion of Ensure, gallbladder ejection fraction was
determined. At 60 min, normal ejection fraction is greater than 33%.
RADIOPHARMACEUTICALS:  5.1 mCi Zc-77m  Choletec IV

[he hepatobiliary · 4.52mm/px · 6 of 60 frames shown (1 of 3)]
[frame 6/60]
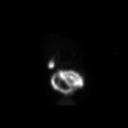
[frame 16/60]
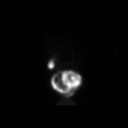
[frame 26/60]
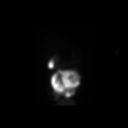
[frame 36/60]
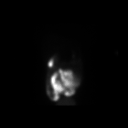
[frame 46/60]
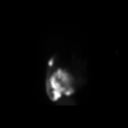
[frame 56/60]
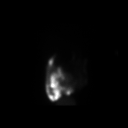

[he hepatobiliary · 4.52mm/px · 6 of 30 frames shown (2 of 3)]
[frame 3/30]
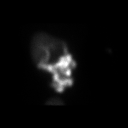
[frame 8/30]
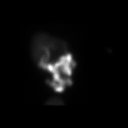
[frame 13/30]
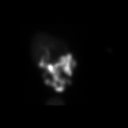
[frame 18/30]
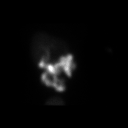
[frame 23/30]
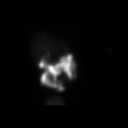
[frame 28/30]
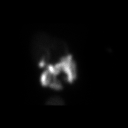

[he hepatobiliary · 4.52mm/px · 6 of 60 frames shown (3 of 3)]
[frame 6/60]
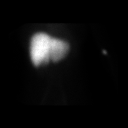
[frame 16/60]
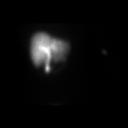
[frame 26/60]
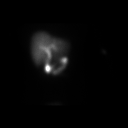
[frame 36/60]
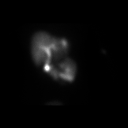
[frame 46/60]
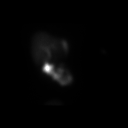
[frame 56/60]
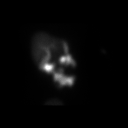

[18 of 18 positions shown; findings below may reference images not displayed]

FINDINGS: Prompt uptake and biliary excretion of activity by the liver is
seen. Gallbladder activity is visualized, consistent with patency of
cystic duct. Biliary activity passes into small bowel, consistent
with patent common bile duct.

Calculated gallbladder ejection fraction is 13%. (Normal gallbladder
ejection fraction with Ensure is greater than 33%.)
IMPRESSION: Reduced gallbladder ejection fraction as can be seen with biliary
dyskinesia/chronic cholecystitis.
